# Patient Record
Sex: Female | Born: 1980 | Race: Black or African American | Hispanic: No | Marital: Single | Smoking: Never smoker
Health system: Southern US, Community
[De-identification: ages and names within clinical notes are randomized; demographics above are authoritative.]

## PROBLEM LIST (undated history)

## (undated) DIAGNOSIS — G43909 Migraine, unspecified, not intractable, without status migrainosus: Secondary | ICD-10-CM

## (undated) DIAGNOSIS — F419 Anxiety disorder, unspecified: Secondary | ICD-10-CM

---
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
## 2004-11-26 ENCOUNTER — Emergency Department (HOSPITAL_COMMUNITY): Admission: EM | Admit: 2004-11-26 | Discharge: 2004-11-26 | Payer: Self-pay | Admitting: Emergency Medicine

## 2004-11-27 ENCOUNTER — Emergency Department (HOSPITAL_COMMUNITY): Admission: EM | Admit: 2004-11-27 | Discharge: 2004-11-27 | Payer: Self-pay | Admitting: Emergency Medicine

## 2005-08-15 ENCOUNTER — Inpatient Hospital Stay (HOSPITAL_COMMUNITY): Admission: AD | Admit: 2005-08-15 | Discharge: 2005-08-20 | Payer: Self-pay | Admitting: Obstetrics and Gynecology

## 2006-10-11 ENCOUNTER — Emergency Department (HOSPITAL_COMMUNITY): Admission: EM | Admit: 2006-10-11 | Discharge: 2006-10-11 | Payer: Self-pay | Admitting: Emergency Medicine

## 2006-11-28 ENCOUNTER — Encounter: Payer: Self-pay | Admitting: Cardiology

## 2006-11-28 ENCOUNTER — Ambulatory Visit (HOSPITAL_COMMUNITY): Admission: RE | Admit: 2006-11-28 | Discharge: 2006-11-28 | Payer: Self-pay | Admitting: Obstetrics and Gynecology

## 2006-11-28 ENCOUNTER — Ambulatory Visit: Payer: Self-pay | Admitting: Cardiology

## 2007-01-24 ENCOUNTER — Inpatient Hospital Stay (HOSPITAL_COMMUNITY): Admission: AD | Admit: 2007-01-24 | Discharge: 2007-01-27 | Payer: Self-pay | Admitting: Obstetrics and Gynecology

## 2007-07-01 ENCOUNTER — Ambulatory Visit (HOSPITAL_COMMUNITY): Admission: RE | Admit: 2007-07-01 | Discharge: 2007-07-01 | Payer: Self-pay | Admitting: Obstetrics and Gynecology

## 2008-03-31 ENCOUNTER — Emergency Department (HOSPITAL_COMMUNITY): Admission: EM | Admit: 2008-03-31 | Discharge: 2008-03-31 | Payer: Self-pay | Admitting: Emergency Medicine

## 2008-12-04 ENCOUNTER — Emergency Department (HOSPITAL_COMMUNITY): Admission: EM | Admit: 2008-12-04 | Discharge: 2008-12-04 | Payer: Self-pay | Admitting: Emergency Medicine

## 2009-07-28 ENCOUNTER — Emergency Department (HOSPITAL_COMMUNITY): Admission: EM | Admit: 2009-07-28 | Discharge: 2009-07-28 | Payer: Self-pay | Admitting: Emergency Medicine

## 2010-03-20 ENCOUNTER — Emergency Department (HOSPITAL_COMMUNITY): Admission: EM | Admit: 2010-03-20 | Discharge: 2010-03-20 | Payer: Self-pay | Admitting: Family Medicine

## 2010-11-01 ENCOUNTER — Emergency Department (HOSPITAL_COMMUNITY)
Admission: EM | Admit: 2010-11-01 | Discharge: 2010-11-01 | Payer: Self-pay | Source: Home / Self Care | Admitting: Emergency Medicine

## 2010-12-10 ENCOUNTER — Encounter: Payer: Self-pay | Admitting: Obstetrics and Gynecology

## 2011-02-23 LAB — URINE CULTURE: Colony Count: >=100000

## 2011-02-23 LAB — URINALYSIS, ROUTINE W REFLEX MICROSCOPIC
Ketones, ur: NEGATIVE mg/dL
Nitrite: POSITIVE — AB
Protein, ur: 100 mg/dL — AB
Urobilinogen, UA: 1 mg/dL (ref 0.0–1.0)

## 2011-02-23 LAB — PREGNANCY, URINE: Preg Test, Ur: NEGATIVE

## 2011-03-05 LAB — POCT PREGNANCY, URINE: Preg Test, Ur: NEGATIVE

## 2011-04-06 NOTE — Op Note (Signed)
NAMEKYLII, ENNIS NO.:  [id number]   MEDICAL RECORD NO.:  [id number]          PATIENT TYPE:  INP   LOCATION:  9304                          FACILITY:  [facility]   PHYSICIAN:  Malva Limes, M.D.    DATE OF BIRTH:  07-Sep-1981   DATE OF PROCEDURE:  08/16/2005  DATE OF DISCHARGE:                                 OPERATIVE REPORT   PREOPERATIVE DIAGNOSES:  1.  Intrauterine pregnancy at term.  2.  Failure to progress.  3.  Preeclampsia.   POSTOPERATIVE DIAGNOSES:  1.  Intrauterine pregnancy at term.  2.  Failure to progress.  3.  Preeclampsia.   OPERATION/PROCEDURE:  Primary low transverse cesarean section.   SURGEON:  Malva Limes, M.D.   ANESTHESIA:  Epidural.   ANTIBIOTICS:  Ancef 1 g.   ESTIMATED BLOOD LOSS:  900 mL.   COMPLICATIONS:  None.   SPECIMENS:  None.   DRAINS:  Foley to bedside drainage.   FINDINGS:  The patient delivered one live viable black female infant in the  OP position.  Placenta appeared to be normal. Fallopian tubes, ovaries and  uterus were all normal.   DESCRIPTION OF PROCEDURE:  The patient was taken to the operating room where  her epidural anesthetic was reinjected.  Once an adequate level was reached,  the patient was prepped with Betadine and draped in the usual fashion for  this procedure.   A Pfannenstiel incision was made.  This was carried down to the fascia.  The  fascia was entered in the midline and extended laterally with Mayo scissors.  Rectus muscles were dissected from the fascia with the Bovie.  Rectus  muscles were divided in the midline and taken superiorly and inferiorly.  Parietal peritoneum was entered bluntly and bladder flap was taken down  sharply.  A low transverse uterine incision was made in the midline and  extended laterally with blunt dissection.  Amniotic fluid was noted to be  clear.  The infant was delivered in the vertex presentation.  The oropharynx  and nostrils were bulb suctioned.  The  remaining infant was then delivered.  Cord blood was then obtained.  The cord was doubly clamped, cut and the  infant handed to the waiting NICU team.  The placenta was mainly removed and  handed off for cord blood banking.  The uterus was exteriorized.  The  uterine cavity was cleaned with a wet lap.  The uterine incision was closed  in a single layer of 0 Monocryl in a running locking fashion.  There was an  area of bleeding on the left margin which was made hemostatic with  interrupted 0 Monocryl suture in a figure-of-eight fashion.  Bladder flap  was closed using 2-0 Monocryl in a running fashion.   The uterus was placed back in the abdominal cavity.  Hemostasis was checked  and found to be adequate.  Parietal peritoneum and rectus muscle were  reapproximated in the midline using 2 Monocryl in a running fashion. The  fascia was closed using 0 Monocryl suture in a running fashion.  Subcuticular tissue was made hemostatic with  the Bovie.  Stainless steel  clips were to close the skin. The patient tolerated the procedure well.  She  was taken to the recovery room in stable condition.  Instrument and laps  counts were correct x2.           ______________________________  Malva Limes, M.D.     MA/MEDQ  D:  08/16/2005  T:  08/17/2005  Job:  [id number]

## 2011-04-06 NOTE — Discharge Summary (Signed)
Michele Harrison, ERBY NO.:  [id number]   MEDICAL RECORD NO.:  [id number]          PATIENT TYPE:  INP   LOCATION:  9111                          FACILITY:  [facility]   PHYSICIAN:  Gerrit Friends. Aldona Bar, M.D.   DATE OF BIRTH:  1981/06/26   DATE OF ADMISSION:  01/24/2007  DATE OF DISCHARGE:                               DISCHARGE SUMMARY   DISCHARGE DIAGNOSES:  1. Term pregnancy, delivered; viable female infant, 8 pounds 6 ounces,      Apgars 8 and 9.  2. Blood type O positive.  3. Breech presentation.  4. Previous cesarean section.   PROCEDURES:  Repeat low-transverse cesarean section.   SUMMARY:  This 30 year old, a gravida 3, para 2, abortus 1 was admitted  to the hospital on January 24, 2007, after a benign pregnancy for delivery  by repeat cesarean section with a known breech presentation.  She was  taken to the operating room and delivered without incident and her  postpartum course was totally benign.  She remained afebrile during her  postoperative course.  Her discharge hemoglobin was 10.1 with a white  count of 14,900 and a platelet count of 170,000.  On the morning of  March 10 she was ambulating well, tolerating a regular diet well, having  normal bowel and bladder function, was afebrile.  Her wound was clean  and dry and she was breast-feeding without difficulty.  Her staples were  removed and her wound was steri-stripped at the time of discharge, and  all appropriate instructions were given to the patient per discharge  brochure.  She will return the office for followup in approximately four  weeks' time or as needed.   CONDITION ON DISCHARGE:  Improved.   MEDICATIONS ON DISCHARGE:  1. Prenatal vitamins - one a day.  2. Ferrous sulfate 300 mg daily.  3. Motrin 600 mg every 6 hours as needed for cramping or pain.  4. Tylox one to two every 4-6 hours as needed for more severe pain.      Gerrit Friends. Aldona Bar, M.D.  Electronically Signed     RMW/MEDQ  D:   01/27/2007  T:  01/27/2007  Job:  [id number]

## 2011-04-06 NOTE — Op Note (Signed)
NAME:  Michele Harrison, NICODEMUS NO.:  [id number]   MEDICAL RECORD NO.:  [id number]          PATIENT TYPE:  INP   LOCATION:  9199                          FACILITY:  [facility]   PHYSICIAN:  Malva Limes, M.D.    DATE OF BIRTH:  [birth year]   DATE OF PROCEDURE:  01/24/2007  DATE OF DISCHARGE:                               OPERATIVE REPORT   PREOPERATIVE DIAGNOSES:  1. Intrauterine pregnancy at term.  2. History of prior cesarean section.  3. Patient desires repeat cesarean section.  4. Breech presentation.   POSTOPERATIVE DIAGNOSES:  1. Intrauterine pregnancy at term.  2. History of prior cesarean section.  3. Patient desires repeat cesarean section.  4. Breech presentation.   PROCEDURE:  Repeat low transverse cesarean section.   SURGEON:  Malva Limes, M.D.   ANESTHESIA:  Spinal anesthesia.   ASSISTANT:  Luvenia Redden, M.D.   ANTIBIOTICS:  Ancef 1 gram.   DRAINS:  Foley to bedside drainage.   ESTIMATED BLOOD LOSS:  900 mL.   COMPLICATIONS:  None.   SPECIMENS:  None.   FINDINGS:  The patient delivered one live viable black female infant,  weighing 8 pounds 6 ounces, Apgars 8 at one minute, 9 at five minutes.   DESCRIPTION OF PROCEDURE:  The patient was taken to the operating room  where spinal anesthesia was administered without complications.  She was  then placed in the dorsal supine position with a left lateral tilt.  The  patient was then prepped with Betadine and a Foley catheter was placed.  She was then draped in the usual fashion for this procedure.  An  incision was made through the previous scar.  This was carried down to  the fascia.  The fascia was entered in the midline and extended  laterally with Mayo scissors.  The rectus muscles were then dissected  from the fascia with the Bovie.  The rectus muscles were divided in the  midline and taken superiorly to inferiorly.  The parietal peritoneum was  entered sharply and taken superiorly and  inferiorly.  The bladder flap  was not taken down.  A low transverse uterine incision was made in the  midline and extended laterally with blunt dissection.  Amniotic fluid  was noted to be clear.  The infant was delivered in the breech  presentation.  On delivery of the head, the oropharynx and nostrils were  bulb suctioned, the cord doubly clamped and cut and the infant was  handed to the waiting NICU team.  Cord blood was then obtained, the  placenta was manually removed.  The uterus was exteriorized and the  uterine cavity was cleaned with wet laps.  The uterine incision was  closed in a single layer of 0 Monocryl suture in a running locked  fashion.  The uterus was placed back in the abdominal cavity and checked  for hemostasis, it appeared to be adequate.  The parietal peritoneum and  rectus muscles were reapproximated in the midline using 2-0 Monocryl in  a running fashion.  The fascia was closed using 0 Monocryl suture in a  running fashion.  The subcuticular tissue was made hemostatic with  Bovie.  Stainless steel clips were used to close the skin.  The patient  tolerated the procedure well,  she was taken to the recovery room in  stable condition.  Instrument and lap counts were correct x2.           ______________________________  Malva Limes, M.D.     MA/MEDQ  D:  01/24/2007  T:  01/24/2007  Job:  [id number]

## 2011-04-06 NOTE — Discharge Summary (Signed)
Michele Harrison, Michele Harrison NO.:  [id number]   MEDICAL RECORD NO.:  [id number]          PATIENT TYPE:  INP   LOCATION:  9110                          FACILITY:  [facility]   PHYSICIAN:  Randye Lobo, M.D.   DATE OF BIRTH:  [birth year]   DATE OF ADMISSION:  08/15/2005  DATE OF DISCHARGE:  08/20/2005                                 DISCHARGE SUMMARY   FINAL DIAGNOSES:  1.  Intrauterine pregnancy at term.  2.  Failure to progress.  3.  Preeclampsia.  4.  Postoperative fever.   PROCEDURE:  Primary low transverse cesarean section. Surgeon:  Dr. Malva Limes. Complications:  None.   This 30 year old G2 P0-0-1-0 presents to [facility] for an induction  secondary to postdates status. The patient's antepartum course up to this  point had been uncomplicated. Cytotec was begun and amniotomy was carried  out with clear fluids, and augmentation was started with Pitocin. The  patient had a protracted labor curve and had no change in her cervix, and at  this point a decision was made to proceed with a cesarean section. The  patient was taken to the operating room on August 16, 2005, by Dr. Malva Limes where a primary low transverse cesarean section was performed with  delivery of an 8-pound 2-ounce female infant with Apgars of 9 and 9.  Postoperatively, the patient's blood pressure started to increase and she  was showing signs of preeclampsia. She was started on magnesium sulfate,  labs were obtained, and urinary output was watched carefully. By  postoperative day #2 the patient had some postoperative anemia as well and  was started on some iron. Her blood pressures were returning to more normal  levels, as well as her labs, and magnesium sulfate was able to be stopped.  Later on that day, the patient started to have low-grade fevers, was started  on Unasyn 1.5 g every 6 hours and was watched carefully. By postoperative  day #4 the patient was still having some low-grade  temperatures but no signs  of any endometritis. She had been on Unasyn already for 24 hours and she was  felt ready for discharge. She was sent home on a regular diet, told to  decrease her activities, told to continue her iron supplement three times a  day, was given Percocet one to two every 4 hours as needed for pain, told  she could use over-the-counter ibuprofen up to 600 mg every 6 hours as  needed for pain, was to follow up in our office in 1 week for blood pressure  check, was also to call with any temperature above 100 degrees and/or  increased pain.   LABORATORY ON DISCHARGE:  The patient had a hemoglobin of 7.6; white blood  cell count of 14.6; platelets of 146,000; and normal LDH, uric acid, and  liver function tests.     Leilani Able, P.A.-C.      Randye Lobo, M.D.  Electronically Signed   MB/MEDQ  D:  09/20/2005  T:  09/20/2005  Job:  [id number]

## 2011-11-28 ENCOUNTER — Encounter (HOSPITAL_COMMUNITY): Payer: Self-pay | Admitting: *Deleted

## 2011-11-28 ENCOUNTER — Emergency Department (HOSPITAL_COMMUNITY)
Admission: EM | Admit: 2011-11-28 | Discharge: 2011-11-28 | Disposition: A | Discharge status: Home / Self Care | Payer: Medicaid Other | Attending: Emergency Medicine | Admitting: Emergency Medicine

## 2011-11-28 DIAGNOSIS — R1310 Dysphagia, unspecified: Secondary | ICD-10-CM | POA: Insufficient documentation

## 2011-11-28 DIAGNOSIS — H9209 Otalgia, unspecified ear: Secondary | ICD-10-CM | POA: Insufficient documentation

## 2011-11-28 DIAGNOSIS — R070 Pain in throat: Secondary | ICD-10-CM | POA: Insufficient documentation

## 2011-11-28 DIAGNOSIS — J020 Streptococcal pharyngitis: Secondary | ICD-10-CM

## 2011-11-28 MED ORDER — PENICILLIN V POTASSIUM 500 MG PO TABS: 500.0000 mg | ORAL_TABLET | Freq: Three times a day (TID) | ORAL | Status: AC

## 2011-11-28 NOTE — ED Notes (Signed)
To ed for eval of sore throat, states she was seen by pmd a week ago and told she had throat abscess. States she was given a shot of abx as well as oral abx. Now entire throat sore and both ear with pain. Airway patent. Appears in nad.

## 2011-11-28 NOTE — ED Provider Notes (Signed)
History     CSN: [id number]  Arrival date & time 11/28/11  1030   First MD Initiated Contact with Patient 11/28/11 1145      Chief Complaint  Patient presents with  . Sore Throat    (Consider location/radiation/quality/duration/timing/severity/associated sxs/prior treatment) HPI Patient states she's been having trouble with a sore throat since about one week ago. Patient states she saw her primary Dr. and was told she had a possible abscess. She was given a shot of antibiotics and prescribed oral antibiotics. Patient states she started to feel a little bit better but then once she finished the medications it returned. Patient says she has some pain in both of her ears as well she's not having trouble breathing. It does hurt when she swallows. Patient denies any cough fevers or abdominal pain. History reviewed. No pertinent past medical history.  History reviewed. No pertinent past surgical history.  No family history on file.  History  Substance Use Topics  . Smoking status: Never Smoker   . Smokeless tobacco: Not on file  . Alcohol Use:     OB History    Grav Para Term Preterm Abortions TAB SAB Ect Mult Living                  Review of Systems  All other systems reviewed and are negative.    Allergies  Review of patient's allergies indicates no known allergies.  Home Medications   Current Outpatient Rx  Name Route Sig Dispense Refill  . AZITHROMYCIN 250 MG PO TABS Oral Take 250 mg by mouth daily. Finished 11/27/11    . IBUPROFEN 200 MG PO TABS Oral Take 200 mg by mouth every 6 (six) hours as needed. For pain    . PRESCRIPTION MEDICATION Oral Take 1 tablet by mouth daily. For congestion      BP 116/71  Pulse 94  Temp(Src) 97.9 F (36.6 C) (Oral)  Resp 18  SpO2 100%  Physical Exam  Nursing note and vitals reviewed. Constitutional: She appears well-developed and well-nourished. No distress.  HENT:  Head: Normocephalic and atraumatic.  Right Ear: Tympanic  membrane and external ear normal.  Left Ear: Tympanic membrane and external ear normal.  Mouth/Throat: Posterior oropharyngeal erythema present. No oropharyngeal exudate, posterior oropharyngeal edema or tonsillar abscesses.  Eyes: Conjunctivae are normal. Right eye exhibits no discharge. Left eye exhibits no discharge. No scleral icterus.  Neck: Neck supple. No tracheal deviation present.  Cardiovascular: Normal rate.   Pulmonary/Chest: Effort normal. No stridor. No respiratory distress.  Abdominal: She exhibits no distension and no mass. There is no tenderness. There is no rebound.  Musculoskeletal: She exhibits no edema.  Neurological: She is alert. Cranial nerve deficit: no gross deficits.  Skin: Skin is warm and dry. No rash noted.  Psychiatric: She has a normal mood and affect.    ED Course  Procedures (including critical care time)  Labs Reviewed  RAPID STREP SCREEN - Abnormal; Notable for the following:    Streptococcus, Group A Screen (Direct) POSITIVE (*)    All other components within normal limits  MONONUCLEOSIS SCREEN   No results found.   1. Strep throat       MDM  Patient has strep throat. We'll give her prescription for penicillin. Patient encouraged to follow up with her doctor if not improving. No evidence of abscess on my exam the        Celene Kras, MD 11/28/11 [phone]

## 2011-11-28 NOTE — ED Notes (Signed)
States was tx 5 days ago [place] was given shot and other rx for congestion but now still having throat pain and it is affecting both ears

## 2013-02-07 ENCOUNTER — Emergency Department (HOSPITAL_COMMUNITY)
Admission: EM | Admit: 2013-02-07 | Discharge: 2013-02-07 | Disposition: A | Discharge status: Home / Self Care | Payer: PRIVATE HEALTH INSURANCE | Attending: Emergency Medicine | Admitting: Emergency Medicine

## 2013-02-07 ENCOUNTER — Encounter (HOSPITAL_COMMUNITY): Payer: Self-pay | Admitting: *Deleted

## 2013-02-07 ENCOUNTER — Emergency Department (HOSPITAL_COMMUNITY): Payer: PRIVATE HEALTH INSURANCE

## 2013-02-07 DIAGNOSIS — J029 Acute pharyngitis, unspecified: Secondary | ICD-10-CM | POA: Insufficient documentation

## 2013-02-07 DIAGNOSIS — J069 Acute upper respiratory infection, unspecified: Secondary | ICD-10-CM | POA: Insufficient documentation

## 2013-02-07 DIAGNOSIS — R599 Enlarged lymph nodes, unspecified: Secondary | ICD-10-CM | POA: Insufficient documentation

## 2013-02-07 DIAGNOSIS — G479 Sleep disorder, unspecified: Secondary | ICD-10-CM | POA: Insufficient documentation

## 2013-02-07 DIAGNOSIS — R1110 Vomiting, unspecified: Secondary | ICD-10-CM | POA: Insufficient documentation

## 2013-02-07 MED ORDER — IBUPROFEN 800 MG PO TABS: 800.0000 mg | ORAL_TABLET | Freq: Three times a day (TID) | ORAL | Status: DC

## 2013-02-07 MED ORDER — IBUPROFEN 800 MG PO TABS
800.0000 mg | ORAL_TABLET | Freq: Once | ORAL | Status: AC
  Administered 2013-02-07: 800 mg via ORAL
  Filled 2013-02-07: qty 1

## 2013-02-07 MED ORDER — ALBUTEROL SULFATE HFA 108 (90 BASE) MCG/ACT IN AERS: 2.0000 | INHALATION_SPRAY | RESPIRATORY_TRACT | Status: DC | PRN

## 2013-02-07 MED ORDER — PROMETHAZINE HCL 25 MG PO TABS: 25.0000 mg | ORAL_TABLET | Freq: Four times a day (QID) | ORAL | Status: DC | PRN

## 2013-02-07 MED ORDER — ACETAMINOPHEN-CODEINE 120-12 MG/5ML PO SOLN
5.0000 mL | Freq: Once | ORAL | Status: AC
  Administered 2013-02-07: 5 mL via ORAL
  Filled 2013-02-07: qty 10

## 2013-02-07 MED ORDER — ACETAMINOPHEN-CODEINE 120-12 MG/5ML PO SUSP: 5.0000 mL | Freq: Four times a day (QID) | ORAL | Status: DC | PRN

## 2013-02-07 NOTE — ED Notes (Signed)
Pt c/o productive cough x 2 weeks.  Tonight, states she coughed so much it caused her to vomit.

## 2013-02-07 NOTE — ED Provider Notes (Signed)
History     CSN: [id number]  Arrival date & time 02/07/13  4098   First MD Initiated Contact with Patient 02/07/13 0411      Chief Complaint  Patient presents with  . Cough    (Consider location/radiation/quality/duration/timing/severity/associated sxs/prior treatment) HPI Hx per PT. Sick x 2 weeks with cough that started as a "head cold" and now she has soret throat, dry cough, and with coughing spells some post tussive emesis.  Taking OTC medications with minal relief, no known sick contacts, moderate in severity, trouble sleeping tonight with these symptoms. No rash. No ABD pain,  No fevers/ chills.  History reviewed. No pertinent past medical history.  Past Surgical History  Procedure Laterality Date  . Cesarean section      History reviewed. No pertinent family history.  History  Substance Use Topics  . Smoking status: Never Smoker   . Smokeless tobacco: Not on file  . Alcohol Use: Yes    OB History   Grav Para Term Preterm Abortions TAB SAB Ect Mult Living                  Review of Systems  Constitutional: Negative for fever and chills.  HENT: Negative for mouth sores, trouble swallowing, neck pain, neck stiffness and voice change.   Eyes: Negative for pain and visual disturbance.  Respiratory: Positive for cough. Negative for chest tightness and shortness of breath.   Cardiovascular: Negative for chest pain.  Gastrointestinal: Negative for abdominal pain and blood in stool.  Genitourinary: Negative for dysuria.  Musculoskeletal: Negative for back pain.  Skin: Negative for rash.  Neurological: Negative for headaches.  All other systems reviewed and are negative.    Allergies  Review of patient's allergies indicates no known allergies.  Home Medications   Current Outpatient Rx  Name  Route  Sig  Dispense  Refill  . azithromycin (ZITHROMAX) 250 MG tablet   Oral   Take 250 mg by mouth daily. Finished 11/27/11         . ibuprofen (ADVIL,MOTRIN) 200  MG tablet   Oral   Take 200 mg by mouth every 6 (six) hours as needed. For pain         . PRESCRIPTION MEDICATION   Oral   Take 1 tablet by mouth daily. For congestion           BP 120/85  Pulse 78  Temp(Src) 98.8 F (37.1 C) (Oral)  Resp 16  SpO2 98%  LMP 12/26/2012  Physical Exam  Constitutional: She is oriented to person, place, and time. She appears well-developed and well-nourished.  HENT:  Head: Normocephalic and atraumatic.  Mouth/Throat: Oropharynx is clear and moist. No oropharyngeal exudate.  Eyes: EOM are normal. Pupils are equal, round, and reactive to light.  Neck: Normal range of motion. Neck supple. No tracheal deviation present.  Cardiovascular: Normal rate, regular rhythm and intact distal pulses.   Pulmonary/Chest: Effort normal. No stridor. No respiratory distress. She has no wheezes. She has no rales. She exhibits no tenderness.  Mildly dec bilateral breath sounds  Abdominal: Soft. Bowel sounds are normal. She exhibits no distension. There is no tenderness.  Musculoskeletal: Normal range of motion. She exhibits no edema.  Lymphadenopathy:    She has cervical adenopathy.  Neurological: She is alert and oriented to person, place, and time.  Skin: Skin is warm and dry.    ED Course  Procedures (including critical care time)  Dg Chest 2 View  02/07/2013  *RADIOLOGY  REPORT*  Clinical Data: Productive cough  CHEST - 2 VIEW  Comparison: None.  Findings: Lungs are clear. No pleural effusion or pneumothorax. The cardiomediastinal contours are within normal limits. The visualized bones and soft tissues are without significant appreciable abnormality.  IMPRESSION: No radiographic evidence of acute cardiopulmonary process.   Original Report Authenticated By: Jearld Lesch, M.D.    Albuterol and codeine provided. CXR reviewed - plan outpatient treatment for URI with precautions verbalized as understood.    MDM  URI symptoms and post tussive emesis.   CXR  obtained. Medications provided  VS and nursing notes reviewed and considered        [name], MD 02/07/13 [phone]

## 2014-11-02 ENCOUNTER — Encounter (HOSPITAL_COMMUNITY): Payer: Self-pay

## 2014-11-02 ENCOUNTER — Emergency Department (HOSPITAL_COMMUNITY)
Admission: EM | Admit: 2014-11-02 | Discharge: 2014-11-02 | Disposition: A | Discharge status: Home / Self Care | Payer: No Typology Code available for payment source | Attending: Emergency Medicine | Admitting: Emergency Medicine

## 2014-11-02 DIAGNOSIS — F419 Anxiety disorder, unspecified: Secondary | ICD-10-CM | POA: Diagnosis not present

## 2014-11-02 DIAGNOSIS — R112 Nausea with vomiting, unspecified: Secondary | ICD-10-CM | POA: Insufficient documentation

## 2014-11-02 DIAGNOSIS — Z79899 Other long term (current) drug therapy: Secondary | ICD-10-CM | POA: Diagnosis not present

## 2014-11-02 DIAGNOSIS — N390 Urinary tract infection, site not specified: Secondary | ICD-10-CM | POA: Diagnosis not present

## 2014-11-02 DIAGNOSIS — R51 Headache: Secondary | ICD-10-CM | POA: Diagnosis present

## 2014-11-02 DIAGNOSIS — Z791 Long term (current) use of non-steroidal anti-inflammatories (NSAID): Secondary | ICD-10-CM | POA: Insufficient documentation

## 2014-11-02 DIAGNOSIS — Z3202 Encounter for pregnancy test, result negative: Secondary | ICD-10-CM | POA: Diagnosis not present

## 2014-11-02 HISTORY — DX: Anxiety disorder, unspecified: F41.9

## 2014-11-02 LAB — CBC WITH DIFFERENTIAL/PLATELET
BASOS ABS: 0 10*3/uL (ref 0.0–0.1)
BASOS PCT: 0 % (ref 0–1)
EOS PCT: 1 % (ref 0–5)
Eosinophils Absolute: 0.1 10*3/uL (ref 0.0–0.7)
HEMATOCRIT: 38.3 % (ref 36.0–46.0)
Hemoglobin: 12.3 g/dL (ref 12.0–15.0)
Lymphocytes Relative: 25 % (ref 12–46)
Lymphs Abs: 2 10*3/uL (ref 0.7–4.0)
MCH: 28.1 pg (ref 26.0–34.0)
MCHC: 32.1 g/dL (ref 30.0–36.0)
MCV: 87.6 fL (ref 78.0–100.0)
MONO ABS: 0.5 10*3/uL (ref 0.1–1.0)
Monocytes Relative: 6 % (ref 3–12)
Neutro Abs: 5.5 10*3/uL (ref 1.7–7.7)
Neutrophils Relative %: 68 % (ref 43–77)
Platelets: 194 10*3/uL (ref 150–400)
RBC: 4.37 MIL/uL (ref 3.87–5.11)
RDW: 12.7 % (ref 11.5–15.5)
WBC: 8.1 10*3/uL (ref 4.0–10.5)

## 2014-11-02 LAB — BASIC METABOLIC PANEL
Anion gap: 9 (ref 5–15)
BUN: 11 mg/dL (ref 6–23)
CALCIUM: 9.4 mg/dL (ref 8.4–10.5)
CO2: 24 mEq/L (ref 19–32)
Chloride: 106 mEq/L (ref 96–112)
Creatinine, Ser: 0.72 mg/dL (ref 0.50–1.10)
GFR calc Af Amer: >90 mL/min (ref >90)
GLUCOSE: 91 mg/dL (ref 70–99)
Potassium: 4.7 mEq/L (ref 3.7–5.3)
SODIUM: 139 meq/L (ref 137–147)

## 2014-11-02 LAB — URINALYSIS, ROUTINE W REFLEX MICROSCOPIC
Bilirubin Urine: NEGATIVE
Glucose, UA: NEGATIVE mg/dL
HGB URINE DIPSTICK: NEGATIVE
Ketones, ur: NEGATIVE mg/dL
Leukocytes, UA: NEGATIVE
NITRITE: POSITIVE — AB
PROTEIN: NEGATIVE mg/dL
Specific Gravity, Urine: 1.018 (ref 1.005–1.030)
UROBILINOGEN UA: 1 mg/dL (ref 0.0–1.0)
pH: 7.5 (ref 5.0–8.0)

## 2014-11-02 LAB — URINE MICROSCOPIC-ADD ON

## 2014-11-02 LAB — PREGNANCY, URINE: PREG TEST UR: NEGATIVE

## 2014-11-02 LAB — LIPASE, BLOOD: LIPASE: 18 U/L (ref 11–59)

## 2014-11-02 MED ORDER — CEPHALEXIN 500 MG PO CAPS: 500.0000 mg | ORAL_CAPSULE | Freq: Four times a day (QID) | ORAL | Status: DC

## 2014-11-02 MED ORDER — ACETAMINOPHEN 500 MG PO TABS
1000.0000 mg | ORAL_TABLET | Freq: Once | ORAL | Status: AC
  Administered 2014-11-02: 1000 mg via ORAL
  Filled 2014-11-02: qty 2

## 2014-11-02 MED ORDER — SODIUM CHLORIDE 0.9 % IV BOLUS (SEPSIS)
1000.0000 mL | Freq: Once | INTRAVENOUS | Status: AC
  Administered 2014-11-02: 1000 mL via INTRAVENOUS

## 2014-11-02 NOTE — ED Notes (Signed)
Pt ambulated to bathroom to give urine specimen 

## 2014-11-02 NOTE — ED Notes (Signed)
Pt here for headaches, anxiety attacks for about a month now. Thinks this may be from stress but not sure. States she has been running fevers off and on also

## 2014-11-02 NOTE — ED Notes (Signed)
Pt unable to urinate at this time.  

## 2014-11-02 NOTE — ED Provider Notes (Signed)
CSN: [id number]     Arrival date & time 11/02/14  0910 History   First MD Initiated Contact with Patient 11/02/14 [phone]     Chief Complaint  Patient presents with  . Headache   Michele Harrison is a 33 y.o. female with a history of anxiety who presents to the emergency department complaining of feeling very anxious for the past 5 months intermittently, as well as a headache and intermittent abdominal cramping. Patient presents with feeling very anxious intermittently for the past month and she feels very anxious and nervous right now since 5 AM this morning. The patient also reports a headache since yesterday at 3 PM there is generalized to her head. Patient reports blurry vision with these headaches. Patient reports her headache is 8 out of 10 and sharp. The patient also reports associated nausea. The patient reports she had intermittent abdominal cramping but none currently. Patient reports she vomited up her breakfast this morning. She reports intermittent subjective fevers. Patient also reports she is feeling palpitations in her chest with anxiety. Patient does not take any medicines for anxiety. The patient denies suicidal or homicidal ideations. The patient denies dysuria, hematuria, lightheadedness, weakness, numbness, tingling, vaginal bleeding, vaginal discharge, diarrhea, ear pain, nasal congestion, chest pain, shortness of breath or wheezing.  (Consider location/radiation/quality/duration/timing/severity/associated sxs/prior Treatment) HPI  Past Medical History  Diagnosis Date  . Anxiety    Past Surgical History  Procedure Laterality Date  . Cesarean section     History reviewed. No pertinent family history. History  Substance Use Topics  . Smoking status: Never Smoker   . Smokeless tobacco: Not on file  . Alcohol Use: Yes   OB History    No data available     Review of Systems  Constitutional: Positive for fever. Negative for chills.  HENT: Negative for congestion, ear  pain, sinus pressure, sneezing and trouble swallowing.   Eyes: Negative for pain and visual disturbance.  Respiratory: Negative for cough, shortness of breath and wheezing.   Cardiovascular: Positive for palpitations. Negative for chest pain and leg swelling.  Gastrointestinal: Positive for nausea, vomiting and abdominal pain. Negative for diarrhea and blood in stool.  Genitourinary: Negative for dysuria, urgency, hematuria, vaginal bleeding, vaginal discharge and difficulty urinating.  Musculoskeletal: Negative for myalgias, back pain, neck pain and neck stiffness.  Skin: Negative for rash and wound.  Neurological: Positive for headaches. Negative for weakness, light-headedness and numbness.  All other systems reviewed and are negative.     Allergies  Other  Home Medications   Prior to Admission medications   Medication Sig Start Date End Date Taking? Authorizing Provider  ibuprofen (ADVIL,MOTRIN) 200 MG tablet Take 400 mg by mouth every 6 (six) hours as needed for pain. For pain   Yes Historical Provider, MD  levonorgestrel (MIRENA) 20 MCG/24HR IUD 1 each by Intrauterine route continuous.   Yes Historical Provider, MD  acetaminophen-codeine 120-12 MG/5ML suspension Take 5 mLs by mouth every 6 (six) hours as needed for pain. Patient not taking: Reported on 11/02/2014 02/07/13   Sunnie NielsenBrian Opitz, MD  cephALEXin (KEFLEX) 500 MG capsule Take 1 capsule (500 mg total) by mouth 4 (four) times daily. 11/02/14   Einar GipWilliam Duncan Wilhemenia Camba, PA-C  ibuprofen (ADVIL,MOTRIN) 800 MG tablet Take 1 tablet (800 mg total) by mouth 3 (three) times daily. Patient not taking: Reported on 11/02/2014 02/07/13   Sunnie NielsenBrian Opitz, MD  promethazine (PHENERGAN) 25 MG tablet Take 1 tablet (25 mg total) by mouth every 6 (six) hours as  needed for nausea. Patient not taking: Reported on 11/02/2014 02/07/13   Sunnie NielsenBrian Opitz, MD   BP 110/91 mmHg  Pulse 65  Temp(Src) 98.1 F (36.7 C) (Oral)  Resp 18  SpO2 100%  LMP  10/25/2014 Physical Exam  Constitutional: She is oriented to person, place, and time. She appears well-developed and well-nourished. No distress.  Patient appears anxious   HENT:  Head: Normocephalic and atraumatic.  Right Ear: External ear normal.  Left Ear: External ear normal.  Nose: Nose normal.  Mouth/Throat: Oropharynx is clear and moist. No oropharyngeal exudate.  Bilateral tympanic membranes are pearly gray without erythema or loss of landmarks. No temporal tenderness.  Eyes: Conjunctivae and EOM are normal. Pupils are equal, round, and reactive to light. Right eye exhibits no discharge. Left eye exhibits no discharge.  Neck: Normal range of motion. Neck supple.  Cardiovascular: Normal rate, regular rhythm, normal heart sounds and intact distal pulses.  Exam reveals no gallop and no friction rub.   No murmur heard. Pulmonary/Chest: Effort normal and breath sounds normal. No respiratory distress. She has no wheezes. She has no rales. She exhibits no tenderness.  Abdominal: Soft. Bowel sounds are normal. She exhibits no distension and no mass. There is no tenderness. There is no rebound and no guarding.  Abdomen is soft and nontender to palpation. No rebound tenderness. No right lower quadrant tenderness. Negative Rovsing sign. Negative psoas and obturator sign.  Musculoskeletal: She exhibits no edema.  Patient is able to angulate without difficulty or assistance.  Lymphadenopathy:    She has no cervical adenopathy.  Neurological: She is alert and oriented to person, place, and time. No cranial nerve deficit. Coordination normal.  Patient's strength is 5 out of 5 in her bilateral upper and lower extremities. Cranial nerves II through XII are intact bilaterally.  Skin: Skin is warm and dry. No rash noted. She is not diaphoretic. No erythema. No pallor.  Psychiatric: Her behavior is normal. Her mood appears anxious.  Patient appears slightly anxious.   Nursing note and vitals  reviewed.   ED Course  Procedures (including critical care time) Labs Review Labs Reviewed  URINALYSIS, ROUTINE W REFLEX MICROSCOPIC - Abnormal; Notable for the following:    APPearance TURBID (*)    Nitrite POSITIVE (*)    All other components within normal limits  URINE MICROSCOPIC-ADD ON - Abnormal; Notable for the following:    Squamous Epithelial / LPF FEW (*)    Bacteria, UA MANY (*)    All other components within normal limits  PREGNANCY, URINE  BASIC METABOLIC PANEL  LIPASE, BLOOD  CBC WITH DIFFERENTIAL    Imaging Review No results found.   EKG Interpretation   Date/Time:  Tuesday November 02 2014 10:07:20 EST Ventricular Rate:  58 PR Interval:  163 QRS Duration: 91 QT Interval:  427 QTC Calculation: 419 R Axis:   77 Text Interpretation:  Sinus rhythm No old tracing to compare Confirmed by  [facility]  MD, ELLIOTT [phone]([id number]) on 11/02/2014 1:15:34 PM      Filed Vitals:   11/02/14 1130 11/02/14 1145 11/02/14 1200 11/02/14 1246  BP: 126/78 117/77 119/75 110/91  Pulse: 68 70 69 65  Temp:    98.1 F (36.7 C)  TempSrc:    Oral  Resp: 18 15 17 18   SpO2: 99% 100% 99% 100%     MDM   Meds given in ED:  Medications  sodium chloride 0.9 % bolus 1,000 mL (0 mLs Intravenous Stopped 11/02/14 1055)  acetaminophen (  TYLENOL) tablet 1,000 mg (1,000 mg Oral Given 11/02/14 1011)    Discharge Medication List as of 11/02/2014 12:40 PM    START taking these medications   Details  cephALEXin (KEFLEX) 500 MG capsule Take 1 capsule (500 mg total) by mouth 4 (four) times daily., Starting 11/02/2014, Until Discontinued, Print        Final diagnoses:  Anxiety  UTI (lower urinary tract infection)   Michele Harrison is a 33 y.o. female with a history of anxiety who presents to the emergency department complaining of feeling very anxious for the past 5 months intermittently, as well as a headache and intermittent abdominal cramping. The patient denies current abdominal pain.  Patient is afebrile and nontoxic-appearing. Patient does appear slightly anxious. Patient's abdomen is soft and nontender to palpation. The patient denies suicidal or homicidal ideations. Patient had a negative urine pregnancy test. The patient's urinalysis was nitrite positive with many bacteria. The patient's BMP and CBC are unremarkable. Patient's lipase is 11. After Tylenol and a fluid bolus of patient reports her headache is resolved and she's feeling much better. We'll treat this patient for urinary tract infection with Keflex. Patient is tolerating by mouth fluids and crackers. Advised patient she needs to follow-up with her primary care provider this week for her anxiety. Patient was provided resources for outpatient treatment. Advised patient return to the emergency department or worsening symptoms or new concerns. The patient verbalized understanding and agreement with plan.  This patient was discussed with Dr. Noland Fordyce who agrees with assessment and plan.     Lawana Chambers, PA-C 11/02/14 1742  Flint Melter, MD 11/02/14 2028

## 2014-11-02 NOTE — Discharge Instructions (Signed)
Generalized Anxiety Disorder Generalized anxiety disorder (GAD) is a mental disorder. It interferes with life functions, including relationships, work, and school. GAD is different from normal anxiety, which everyone experiences at some point in their lives in response to specific life events and activities. Normal anxiety actually helps us prepare for and get through these life events and activities. Normal anxiety goes away after the event or activity is over.  GAD causes anxiety that is not necessarily related to specific events or activities. It also causes excess anxiety in proportion to specific events or activities. The anxiety associated with GAD is also difficult to control. GAD can vary from mild to severe. People with severe GAD can have intense waves of anxiety with physical symptoms (panic attacks).  SYMPTOMS The anxiety and worry associated with GAD are difficult to control. This anxiety and worry are related to many life events and activities and also occur more days than not for 6 months or longer. People with GAD also have three or more of the following symptoms (one or more in children):  Restlessness.   Fatigue.  Difficulty concentrating.   Irritability.  Muscle tension.  Difficulty sleeping or unsatisfying sleep. DIAGNOSIS GAD is diagnosed through an assessment by your health care provider. Your health care provider will ask you questions aboutyour mood,physical symptoms, and events in your life. Your health care provider may ask you about your medical history and use of alcohol or drugs, including prescription medicines. Your health care provider may also do a physical exam and blood tests. Certain medical conditions and the use of certain substances can cause symptoms similar to those associated with GAD. Your health care provider may refer you to a mental health specialist for further evaluation. TREATMENT The following therapies are usually used to treat GAD:    Medication. Antidepressant medication usually is prescribed for long-term daily control. Antianxiety medicines may be added in severe cases, especially when panic attacks occur.   Talk therapy (psychotherapy). Certain types of talk therapy can be helpful in treating GAD by providing support, education, and guidance. A form of talk therapy called cognitive behavioral therapy can teach you healthy ways to think about and react to daily life events and activities.  Stress managementtechniques. These include yoga, meditation, and exercise and can be very helpful when they are practiced regularly. A mental health specialist can help determine which treatment is best for you. Some people see improvement with one therapy. However, other people require a combination of therapies. Document Released: 03/02/2013 Document Revised: 03/22/2014 Document Reviewed: 03/02/2013 [facility] Patient Information 2015 [place]coeExitCare, [place]LLC. This information is not intended to replace advice given to you by your health care provider. Make sure you discuss any questions you have with your health care provider. Urinary Tract Infection Urinary tract infections (UTIs) can develop anywhere along your urinary tract. Your urinary tract is your body's drainage system for removing wastes and extra water. Your urinary tract includes two kidneys, two ureters, a bladder, and a urethra. Your kidneys are a pair of bean-shaped organs. Each kidney is about the size of your fist. They are located below your ribs, one on each side of your spine. CAUSES Infections are caused by microbes, which are microscopic organisms, including fungi, viruses, and bacteria. These organisms are so small that they can only be seen through a microscope. Bacteria are the microbes that most commonly cause UTIs. SYMPTOMS  Symptoms of UTIs may vary by age and gender of the patient and by the location of the infection.  Symptoms in young women typically include a frequent  and intense urge to urinate and a painful, burning feeling in the bladder or urethra during urination. Older women and men are more likely to be tired, shaky, and weak and have muscle aches and abdominal pain. A fever may mean the infection is in your kidneys. Other symptoms of a kidney infection include pain in your back or sides below the ribs, nausea, and vomiting. DIAGNOSIS To diagnose a UTI, your caregiver will ask you about your symptoms. Your caregiver also will ask to provide a urine sample. The urine sample will be tested for bacteria and white blood cells. White blood cells are made by your body to help fight infection. TREATMENT  Typically, UTIs can be treated with medication. Because most UTIs are caused by a bacterial infection, they usually can be treated with the use of antibiotics. The choice of antibiotic and length of treatment depend on your symptoms and the type of bacteria causing your infection. HOME CARE INSTRUCTIONS  If you were prescribed antibiotics, take them exactly as your caregiver instructs you. Finish the medication even if you feel better after you have only taken some of the medication.  Drink enough water and fluids to keep your urine clear or pale yellow.  Avoid caffeine, tea, and carbonated beverages. They tend to irritate your bladder.  Empty your bladder often. Avoid holding urine for long periods of time.  Empty your bladder before and after sexual intercourse.  After a bowel movement, women should cleanse from front to back. Use each tissue only once. SEEK MEDICAL CARE IF:   You have back pain.  You develop a fever.  Your symptoms do not begin to resolve within 3 days. SEEK IMMEDIATE MEDICAL CARE IF:   You have severe back pain or lower abdominal pain.  You develop chills.  You have nausea or vomiting.  You have continued burning or discomfort with urination. MAKE SURE YOU:   Understand these instructions.  Will watch your  condition.  Will get help right away if you are not doing well or get worse. Document Released: 08/15/2005 Document Revised: 05/06/2012 Document Reviewed: 12/14/2011 [facility]ExitCare Patient Information 2015 WrightExitCare, MarylandLLC. This information is not intended to replace advice given to you by your health care provider. Make sure you discuss any questions you have with your health care provider.  Emergency Department Resource Guide 1) Find a Doctor and Pay Out of Pocket Although you won't have to find out who is covered by your insurance plan, it is a good idea to ask around and get recommendations. You will then need to call the office and see if the doctor you have chosen will accept you as a new patient and what types of options they offer for patients who are self-pay. Some doctors offer discounts or will set up payment plans for their patients who do not have insurance, but you will need to ask so you aren't surprised when you get to your appointment.  2) Contact Your Local Health Department Not all health departments have doctors that can see patients for sick visits, but many do, so it is worth a call to see if yours does. If you don't know where your local health department is, you can check in your phone book. The CDC also has a tool to help you locate your state's health department, and many state websites also have listings of all of their local health departments.  3) Find a Walk-in Clinic If your illness  is not likely to be very severe or complicated, you may want to try a walk in clinic. These are popping up all over the country in pharmacies, drugstores, and shopping centers. They're usually staffed by nurse practitioners or physician assistants that have been trained to treat common illnesses and complaints. They're usually fairly quick and inexpensive. However, if you have serious medical issues or chronic medical problems, these are probably not your best option.  No Primary Care  Doctor: - Call Health Connect at  [phone] - they can help you locate a primary care doctor that  accepts your insurance, provides certain services, etc. - Physician Referral Service- [phone]  Chronic Pain Problems: Organization         Address  Phone   Notes  [facility]  [phone] Patients need to be referred by their primary care doctor.   Medication Assistance: Organization         Address  Phone   Notes  [facility] Medication [facility] [address] [place], [place] [address] [phone] --Must be a resident of [facility] -- Must have NO insurance coverage whatsoever (no Medicaid/ Medicare, etc.) -- The pt. MUST have a primary care doctor that directs their care regularly and follows them in the community   Med[facility]ist  [phone]   [employer]  [phone]    Agencies that provide inexpensive medical care: Organization         Address  Phone   Notes  [place]  [phone]   [facility]    [phone]   [facility] [place], [place] [address] [phone]   [place] [address], [place] [phone]   Planned Parenthood    7168131328   [facility]    [phone]   [place] and [place] [address], [place] Phone:  [phone], Fax:  [phone] Hours of Operation:  9 am - 6 pm, M-F.  Also accepts Medicaid/Medicare and self-pay.  [facility] for [place], [address], [place] Phone: [phone], Fax: [phone]. Hours of Operation:  8:30 am - 5:30 pm, M-F.  Also accepts Medicaid and self-pay.  [facility] [place] [address] [place] Phone: [phone]   [place], [place], [place] [phone], Ext. [phone] Mondays & Thursdays: 7-9 AM.  First 15 patients are seen on a first  come, first serve basis.    [place] Providers:  Organization         Address  Phone   Notes  [facility] [address], [place] [phone] Also accepts self-pay patients.  [facility] [address], [place]  [phone]   [place], [address], [place] [phone]   [facility] [address], [place] [phone]   Lucianne Lei [address], [place]   [phone] Only accepts [place] Access [place] patients after they have their name applied to their card.   Self-Pay (no insurance) in [facility]:  Organization         Address  Phone   Notes  Sickle Cell Patients, [facility] Internal Medicine [place], [place] [phone]  [facility] [place] [phone]   [facility] [place]  [place], [address], [place] [phone]   [facility]/Dr. Osei-Bonsu  [address] [place] or [address], [place] [phone] Phone number for both [place] and [place] locations is the same.  Urgent Medical and [facility] [address], [place] [phone]   [facility] [address], [place] or [address] [phone]   [facility] [address] [place] [phone], phone; [phone], fax Sees patients 1st and 3rd Saturday of every month.  Must not qualify for public or private insurance (i.e. Medicaid, Medicare, [place] Health Choice, Veterans' Benefits)  Household income should be no more than 200% of the poverty level The clinic cannot treat you if you are pregnant or think you are pregnant  Sexually transmitted diseases are not treated at the clinic.    Dental Care: Organization          Address  Phone  Notes  [facility] Department of [facility] [place] [phone] Accepts children up to age 62 who are enrolled in [place] or [place]; pregnant women with a Medicaid card; and children who have applied for Medicaid or [place] Health Choice, but were declined, whose parents can pay a reduced fee at time of service.  [facility] Department of [facility]  [address], [place] [phone] Accepts children up to age 48 who are enrolled in [place] or [place]; pregnant women with a Medicaid card; and children who have applied for Medicaid or [place] Health Choice, but were declined, whose parents can pay a reduced fee at time of service.  [place] Adult Dental Access PROGRAM  [place] [phone] Patients are seen by appointment only. Walk-ins are not accepted. [place] will see patients 75 years of age and older. Monday - Tuesday (8am-5pm) Most Wednesdays (8:30-5pm) $30 per visit, cash only  [facility] Adult [facility] Access PRO[facility]  [address], [facility] [phone] Patients are seen by appointment only. Walk-ins are not accepted. [place] will see patients 80 years of age and older. One Wednesday Evening (Monthly: Volunteer Based).  $30 per visit, cash only  [place]  [phone] for adults; Children under age 62, call Graduate Pediatric Dentistry at [phone]. Children aged 58-14, please call [phone] to request a pediatric application.  Dental services are provided in all areas of dental care including fillings, crowns and bridges, complete and partial dentures, implants, gum treatment, root canals, and extractions. Preventive care is also provided. Treatment is provided to both adults and children. Patients are selected via a lottery and there is often a waiting list.   [facility] [address] [place]  [phone] [email or web address]   [facility] [address] [place], [place] [phone], Ext. [phone] Second and Fourth Thursday of each month, opens at 6:30 AM; Clinic ends at 9 AM.  Patients are seen on a first-come first-served basis, and a limited number are seen during each clinic.   [facility]  [address] Hillard Danker [place], [place] [phone]   Eligibility Requirements You must have lived in [place], [place], or [place] counties for at least the  last three months.   You cannot be eligible for state or federal sponsored [employer], including [employer], [place], or [employer].   You generally cannot be eligible for healthcare insurance through your employer.    How to apply: Eligibility screenings are held every Tuesday and Wednesday afternoon from 1:00 pm until 4:00 pm. You do not need an appointment for the interview!  [facility] [address] [place], [place]   [place]  [place] Department  [place]  [phone]    Behavioral Health Resources in the Community: Intensive Outpatient Programs Organization         Address  Phone  Notes  [place]. [address], [place], [place] [phone]   [facility] Outpatient [address] [place], [place]   ADS: Alcohol & Drug Svcs [address] [place], [place]   [place] [address],  [place], [place] or [phone]   Substance Abuse Resources Organization         Address  Phone  Notes  Alcohol and Drug Services  [phone]   [place]  [phone]   [facility] [place]   Chinita Pester  [phone]   Residential & Outpatient Substance Abuse Program  [phone]   Psychological  Services Organization         Address  Phone  Notes  [facility] [place]  [place]  [phone]   [place] [address], [place] or [phone]    Mobile Crisis Teams Organization         Address  Phone  Notes  [facility], Mobile Crisis Care Unit  [phone]   [facility]otherapeutic [facility]  [address] [place], [place]   [place] Levels [address], [place] [phone]    Self-Help/Support Groups Organization         Address  Phone             Notes  [place]. of [place] - variety of support groups  [place] Call for more information  Narcotics Anonymous (NA), [facility]aring [facility] [address], [employer] [place]  2 meetings at this location   Special educational needs teacher         Address  Phone  Notes  [facility]AP Residential Treatment [place],    [place]  [phone]   [facility]  [address] [place] [phone], [place], [place]   [facility] [place], [place] [phone] Admissions: 8am-3pm M-F  Incentives Substance [place] [address]    [place], [place] [phone]   [facility] [address] [place], [place], [place]   [facility] [address]  [place], [place]   [facility] Programs - Intensive Outpatient [address], [place], [place]   [facility] ([place].,  [place], [place] [phone] or [phone]   Residential Treatment Services (RTS) [address] [place], [place] Accepts Medicaid  [facility] [employer] [address],  [place] [phone] Substance Abuse/Addiction Treatment   [facility] Organization         Address  Phone  Notes  [facility]  [phone]  Domenic Schwab, PhD [address] Arlis Porta [place], [place]   [phone] or [phone]   [facility], [place] [phone]   [facility], [place], [place] [phone] Insurance/Medicaid/sponsorship through [facility] and Families [address], [address] [place]                                    [place], [place] [phone] [place] [address], [place] [phone]    Dr. Adele Schilder  [phone]   [facility] Dept. 1) [address] [place] 2) [place] 3)  [place] [address], [place] [phone]  [phone]   [place] [phone] or [phone] (After Hours)

## 2015-07-06 ENCOUNTER — Encounter (HOSPITAL_COMMUNITY): Payer: Self-pay | Admitting: Emergency Medicine

## 2015-07-06 ENCOUNTER — Emergency Department (HOSPITAL_COMMUNITY)
Admission: EM | Admit: 2015-07-06 | Discharge: 2015-07-06 | Disposition: A | Discharge status: Home / Self Care | Payer: No Typology Code available for payment source | Attending: Emergency Medicine | Admitting: Emergency Medicine

## 2015-07-06 DIAGNOSIS — Z8659 Personal history of other mental and behavioral disorders: Secondary | ICD-10-CM | POA: Insufficient documentation

## 2015-07-06 DIAGNOSIS — N898 Other specified noninflammatory disorders of vagina: Secondary | ICD-10-CM | POA: Insufficient documentation

## 2015-07-06 DIAGNOSIS — Z793 Long term (current) use of hormonal contraceptives: Secondary | ICD-10-CM | POA: Insufficient documentation

## 2015-07-06 LAB — WET PREP, GENITAL
Trich, Wet Prep: NONE SEEN
WBC WET PREP: NONE SEEN
Yeast Wet Prep HPF POC: NONE SEEN

## 2015-07-06 LAB — GC/CHLAMYDIA PROBE AMP (~~LOC~~) NOT AT ARMC
Chlamydia: NEGATIVE
Neisseria Gonorrhea: NEGATIVE

## 2015-07-06 MED ORDER — FLUCONAZOLE 150 MG PO TABS: 150.0000 mg | ORAL_TABLET | ORAL | Status: AC | PRN

## 2015-07-06 NOTE — ED Notes (Signed)
Pt d/c'd from continuous pulse oximetry and blood pressure cuff; pt already dressed and waiting for discharge paperwork

## 2015-07-06 NOTE — ED Provider Notes (Signed)
CSN: [id number]     Arrival date & time 07/06/15  0010 History  This chart was scribed for Michele Kaplan, MD by Michele Harrison, ED Scribe. This patient was seen in room A11C/A11C and the patient's care was started at 4:01 AM.     Chief Complaint  Patient presents with  . Vaginal Discharge  . Vaginal Itching   The history is provided by the patient. No language interpreter was used.    HPI Comments: Michele Harrison is a 34 y.o. female with Hx of anxiety who presents to the Emergency Department complaining of moderate, uncganged thick, yellow vaginal discharge with one episode of bleeding onset 3 weeks ago. Pt reports that she thought had a yeast infection due to a recent change in soap from mild Dove to cocoa butter scented. LNMP last month. She states she does not believe she is pregnant. She is currently sexually active and exclusive with her partner. No Hx of STD in the past year. Not on any medications. She has an IUD. She notes no relief with monistat. She denies abdominal pain.   Past Medical History  Diagnosis Date  . Anxiety    Past Surgical History  Procedure Laterality Date  . Cesarean section     No family history on file. Social History  Substance Use Topics  . Smoking status: Never Smoker   . Smokeless tobacco: None  . Alcohol Use: Yes   OB History    No data available     Review of Systems  Gastrointestinal: Negative for abdominal pain.  Genitourinary: Positive for vaginal bleeding and vaginal discharge.   A complete 10 system review of systems was obtained and all systems are negative except as noted in the HPI and PMH.     Allergies  Other  Home Medications   Prior to Admission medications   Medication Sig Start Date End Date Taking? Authorizing Provider  levonorgestrel (MIRENA) 20 MCG/24HR IUD 1 each by Intrauterine route continuous.   Yes Historical Provider, MD  acetaminophen-codeine 120-12 MG/5ML suspension Take 5 mLs by mouth every 6 (six) hours as  needed for pain. Patient not taking: Reported on 11/02/2014 02/07/13   [name], MD  cephALEXin (KEFLEX) 500 MG capsule Take 1 capsule (500 mg total) by mouth 4 (four) times daily. Patient not taking: Reported on 07/06/2015 11/02/14   [name], PA-C  fluconazole (DIFLUCAN) 150 MG tablet Take 1 tablet (150 mg total) by mouth every other day as needed. 07/06/15 07/13/15  Michele Kaplan, MD  ibuprofen (ADVIL,MOTRIN) 800 MG tablet Take 1 tablet (800 mg total) by mouth 3 (three) times daily. Patient not taking: Reported on 11/02/2014 02/07/13   [name], MD  promethazine (PHENERGAN) 25 MG tablet Take 1 tablet (25 mg total) by mouth every 6 (six) hours as needed for nausea. Patient not taking: Reported on 11/02/2014 02/07/13   [name], MD   BP 119/82 mmHg  Pulse 71  Temp(Src) 98.5 F (36.9 C) (Oral)  Resp 18  SpO2 100%  LMP 06/03/2015 Physical Exam  Constitutional: She is oriented to person, place, and time. She appears well-developed and well-nourished.  HENT:  Head: Normocephalic and atraumatic.  Eyes: Conjunctivae and EOM are normal. Pupils are equal, round, and reactive to light.  Neck: Normal range of motion. Neck supple.  Cardiovascular: Normal rate, regular rhythm, normal heart sounds and intact distal pulses.   No murmur heard. Pulmonary/Chest: Effort normal and breath sounds normal. No respiratory distress.  Lungs CTA.   Abdominal:  Soft. Bowel sounds are normal. She exhibits no distension. There is no tenderness. There is no rebound and no guarding.  Genitourinary: Vagina normal and uterus normal.  External exam - normal, no lesions Speculum exam: Pt has some white discharge, no blood Bimanual exam: Patient has no CMT, no adnexal tenderness or fullness and cervical os is closed  Musculoskeletal: Normal range of motion.  Neurological: She is alert and oriented to person, place, and time.  Skin: Skin is warm and dry.  Psychiatric: She has a normal mood and affect. Her  behavior is normal.  Nursing note and vitals reviewed.   ED Course  Procedures (including critical care time) DIAGNOSTIC STUDIES: Oxygen Saturation is 98% on RA, normal by my interpretation.    COORDINATION OF CARE: 4:04 AM Discussed treatment plan with pt at bedside and pt agreed to plan.   Labs Review Labs Reviewed  WET PREP, GENITAL - Abnormal; Notable for the following:    Clue Cells Wet Prep HPF POC FEW (*)    All other components within normal limits  GC/CHLAMYDIA PROBE AMP ([place]) NOT AT [facility]    Imaging Review No results found. I have personally reviewed and evaluated these images and lab results as part of my medical decision-making.   EKG Interpretation None      MDM   Final diagnoses:  Vaginal discharge    I personally performed the services described in this documentation, which was scribed in my presence. The recorded information has been reviewed and is accurate.  Pt comes in with cc of vaginal discharge. There is a white d/c. She has some itching. The wet prep is neg. GC and chlamydia also sent. We will still tx pt has difucan in case we have a false neg.  Michele Kaplan, MD 07/07/15 1045

## 2015-07-06 NOTE — Discharge Instructions (Signed)
Please take the meds as prescribed. The ER results show no yeast, but take the meds to see if the infection clears.

## 2015-07-06 NOTE — ED Notes (Signed)
Pt. reports vaginal discharge/itching for 3 weeks , denies dysuria , no fever or chills.

## 2019-06-02 ENCOUNTER — Ambulatory Visit: Payer: Self-pay

## 2019-06-02 ENCOUNTER — Telehealth: Payer: Self-pay

## 2019-06-02 DIAGNOSIS — Z20822 Contact with and (suspected) exposure to covid-19: Secondary | ICD-10-CM

## 2019-06-02 NOTE — Telephone Encounter (Signed)
Incoming call from [employer] [facility] [employer]  Requesting that Patient be tested for  Covid-19.  Call to Patient.  Left message for return call.

## 2019-06-02 NOTE — Telephone Encounter (Signed)
Incoming call from [place] [facility] [place] requesting that Patient be tested for Covid-19.  Call to Patient.  Patient schedule for Wed.  06/03/19 @ 08:45am .  Patient voices understanding.

## 2019-06-02 NOTE — Telephone Encounter (Signed)
Incoming call from [facility] requesting that Fraser Din be tested for Coid-19.

## 2019-06-03 ENCOUNTER — Other Ambulatory Visit: Payer: Self-pay

## 2019-06-03 DIAGNOSIS — Z20822 Contact with and (suspected) exposure to covid-19: Secondary | ICD-10-CM

## 2019-06-07 LAB — NOVEL CORONAVIRUS, NAA: SARS-CoV-2, NAA: NOT DETECTED

## 2019-09-04 ENCOUNTER — Other Ambulatory Visit: Payer: Self-pay | Admitting: Obstetrics and Gynecology

## 2019-09-04 DIAGNOSIS — N6320 Unspecified lump in the left breast, unspecified quadrant: Secondary | ICD-10-CM

## 2019-09-10 ENCOUNTER — Ambulatory Visit
Admission: RE | Admit: 2019-09-10 | Discharge: 2019-09-10 | Disposition: A | Discharge status: Home / Self Care | Payer: BC Managed Care – PPO | Source: Ambulatory Visit | Attending: Obstetrics and Gynecology | Admitting: Obstetrics and Gynecology

## 2019-09-10 ENCOUNTER — Other Ambulatory Visit: Payer: Self-pay

## 2019-09-10 DIAGNOSIS — N6320 Unspecified lump in the left breast, unspecified quadrant: Secondary | ICD-10-CM

## 2020-03-03 ENCOUNTER — Other Ambulatory Visit: Payer: Self-pay | Admitting: Obstetrics and Gynecology

## 2020-03-03 DIAGNOSIS — N63 Unspecified lump in unspecified breast: Secondary | ICD-10-CM

## 2020-03-04 ENCOUNTER — Other Ambulatory Visit: Payer: Self-pay | Admitting: Obstetrics and Gynecology

## 2020-03-04 ENCOUNTER — Ambulatory Visit
Admission: RE | Admit: 2020-03-04 | Discharge: 2020-03-04 | Disposition: A | Discharge status: Home / Self Care | Payer: BC Managed Care – PPO | Source: Ambulatory Visit | Attending: Obstetrics and Gynecology | Admitting: Obstetrics and Gynecology

## 2020-03-04 ENCOUNTER — Other Ambulatory Visit: Payer: Self-pay

## 2020-03-04 DIAGNOSIS — N644 Mastodynia: Secondary | ICD-10-CM

## 2020-03-04 DIAGNOSIS — N6320 Unspecified lump in the left breast, unspecified quadrant: Secondary | ICD-10-CM

## 2020-03-04 DIAGNOSIS — N63 Unspecified lump in unspecified breast: Secondary | ICD-10-CM

## 2020-06-23 IMAGING — MG DIGITAL DIAGNOSTIC BILAT W/ TOMO W/ CAD
6 of 12 series · 6 of 36 positions shown · non-contrast
Comparison: Previous exam(s).

CLINICAL DATA: Palpable lump in the left breast

EXAM:
DIGITAL DIAGNOSTIC BILATERAL MAMMOGRAM WITH CAD AND TOMO
ULTRASOUND LEFT BREAST

[L CC synth-2D]
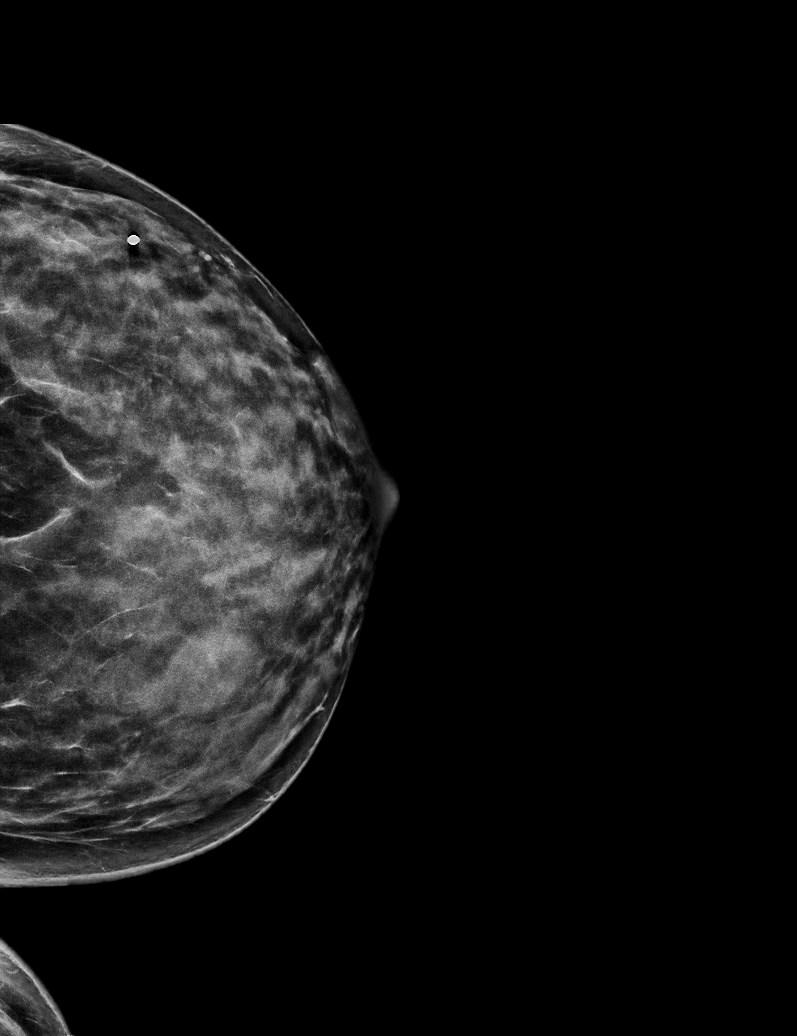

[R CC synth-2D]
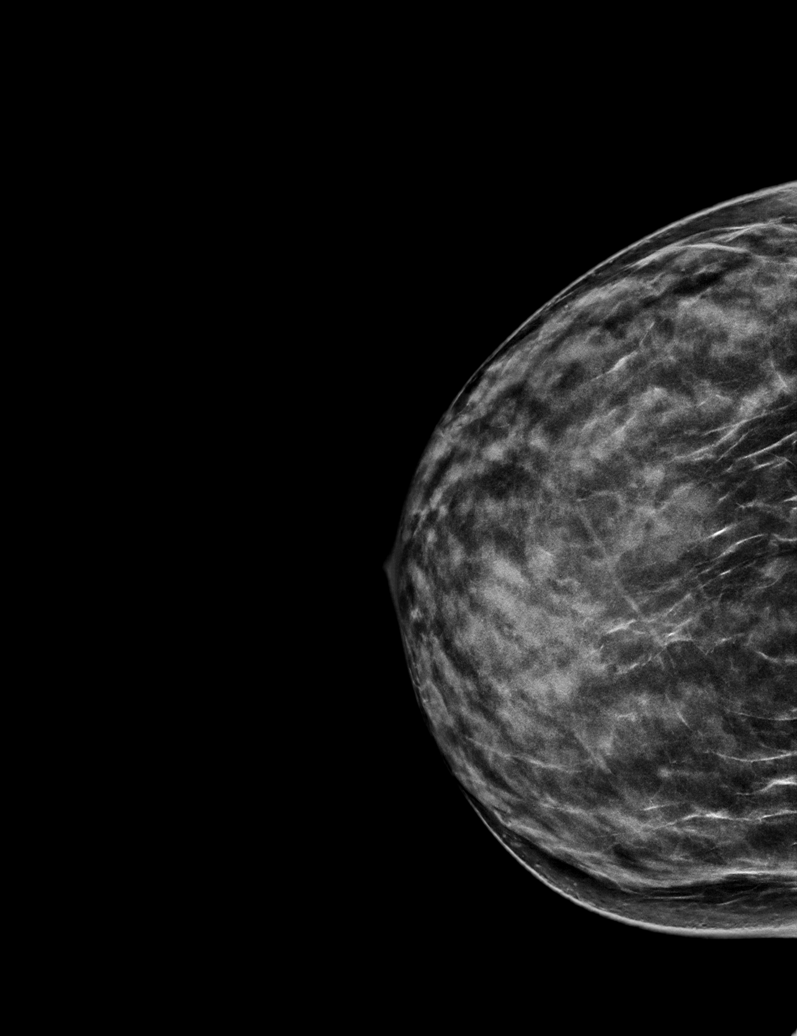

[R MLO synth-2D]
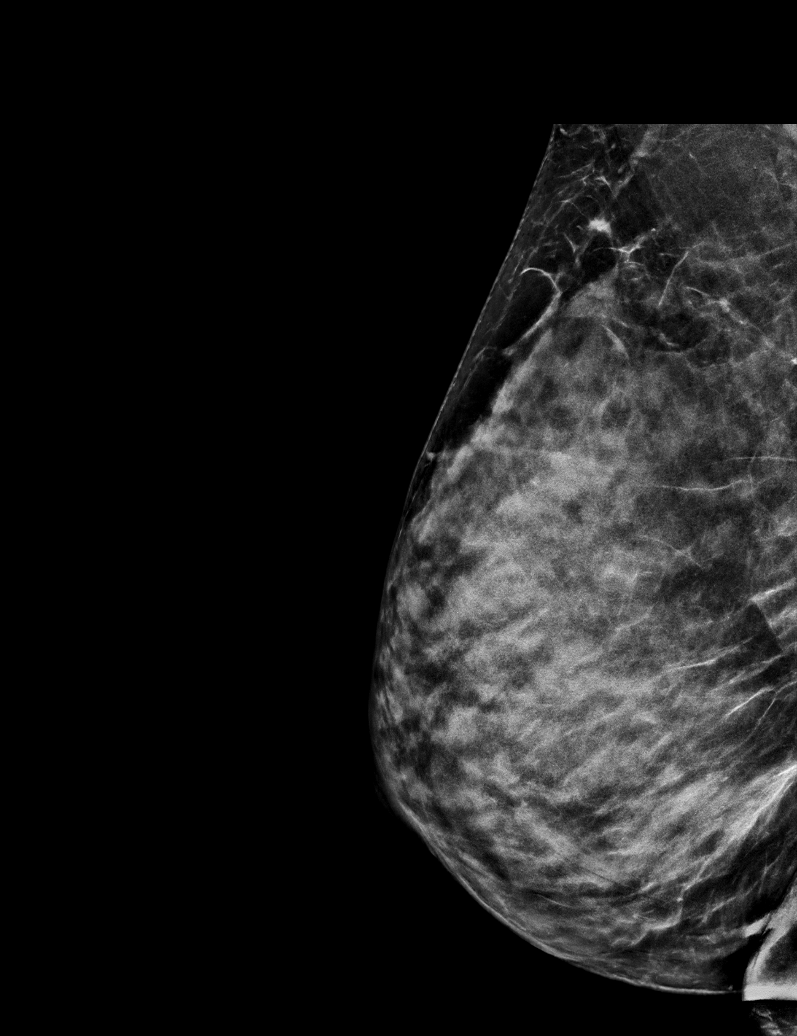

[L MLO synth-2D]
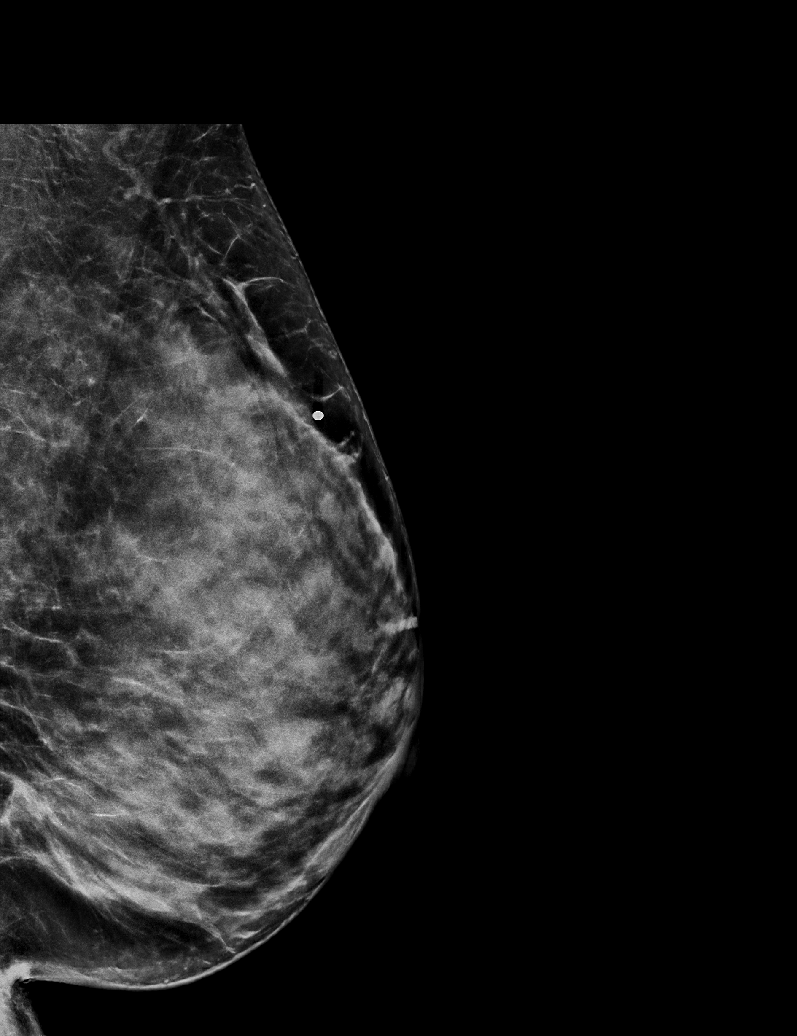

[L XCCL synth-2D]
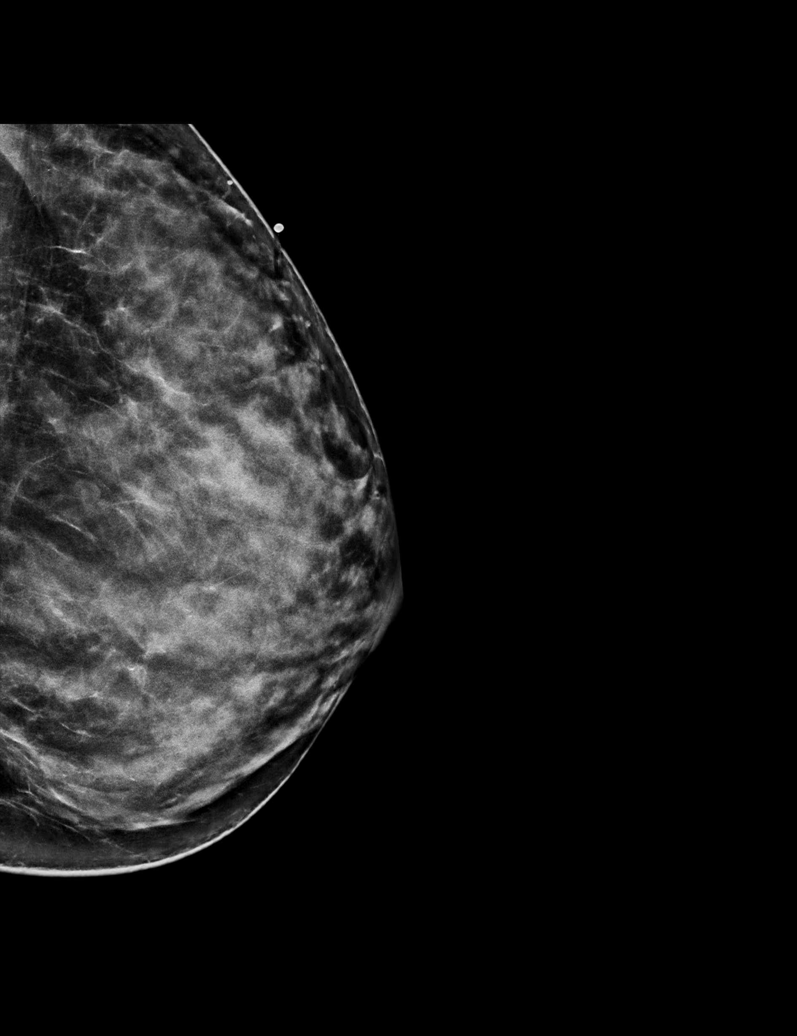

[L TAN synth-2D]
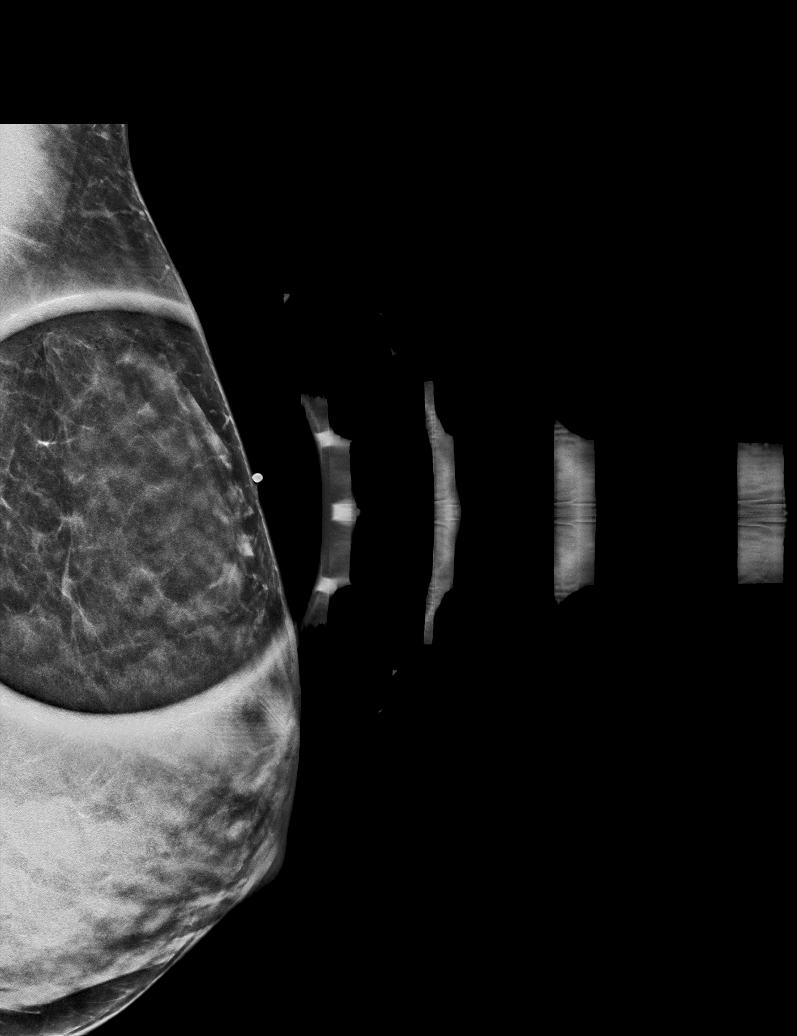

[6 of 36 positions shown; findings below may reference images not displayed]

ACR Breast Density Category d: The breast tissue is extremely dense,
which lowers the sensitivity of mammography.
FINDINGS: There is a mass in the medial superior left breast. No other
suspicious mammographic findings are identified.

Mammographic images were processed with CAD.

On physical exam, no suspicious lumps are identified.

Targeted ultrasound is performed, showing an island of dense
glandular tissue in the region of the patient's lump. Multiple cysts
are seen throughout the left breast, correlating with the
mammographically identified mass.
IMPRESSION: Fibrocystic changes.  No evidence of malignancy.

RECOMMENDATION:
Treatment of the patient's symptoms should be based on clinical and
physical exam given lack of imaging findings. Recommend annual
screening mammography beginning at the age of 40.

I have discussed the findings and recommendations with the patient.
If applicable, a reminder letter will be sent to the patient
regarding the next appointment.

BI-RADS CATEGORY  2: Benign.

## 2022-10-15 ENCOUNTER — Other Ambulatory Visit: Payer: Self-pay | Admitting: Obstetrics and Gynecology

## 2022-10-15 DIAGNOSIS — R928 Other abnormal and inconclusive findings on diagnostic imaging of breast: Secondary | ICD-10-CM

## 2023-06-20 ENCOUNTER — Ambulatory Visit
Admission: RE | Admit: 2023-06-20 | Discharge: 2023-06-20 | Disposition: A | Discharge status: Home / Self Care | Payer: BC Managed Care – PPO | Source: Ambulatory Visit | Attending: Obstetrics and Gynecology | Admitting: Obstetrics and Gynecology

## 2023-06-20 DIAGNOSIS — R928 Other abnormal and inconclusive findings on diagnostic imaging of breast: Secondary | ICD-10-CM

## 2024-12-24 ENCOUNTER — Encounter (HOSPITAL_COMMUNITY): Payer: Self-pay | Admitting: Emergency Medicine

## 2024-12-24 ENCOUNTER — Emergency Department (HOSPITAL_COMMUNITY)
Admission: EM | Admit: 2024-12-24 | Discharge: 2024-12-24 | Disposition: A | Discharge status: Home / Self Care | Source: Ambulatory Visit | Attending: Emergency Medicine | Admitting: Emergency Medicine

## 2024-12-24 ENCOUNTER — Emergency Department (HOSPITAL_COMMUNITY)

## 2024-12-24 ENCOUNTER — Other Ambulatory Visit: Payer: Self-pay

## 2024-12-24 ENCOUNTER — Ambulatory Visit (HOSPITAL_COMMUNITY)
Admission: EM | Admit: 2024-12-24 | Discharge: 2024-12-24 | Disposition: A | Discharge status: Home / Self Care | Source: Ambulatory Visit | Attending: Family Medicine | Admitting: Family Medicine

## 2024-12-24 DIAGNOSIS — G43001 Migraine without aura, not intractable, with status migrainosus: Secondary | ICD-10-CM | POA: Insufficient documentation

## 2024-12-24 DIAGNOSIS — R519 Headache, unspecified: Secondary | ICD-10-CM | POA: Diagnosis not present

## 2024-12-24 HISTORY — DX: Migraine, unspecified, not intractable, without status migrainosus: G43.909

## 2024-12-24 LAB — CBC WITH DIFFERENTIAL/PLATELET
Abs Immature Granulocytes: 0.03 10*3/uL (ref 0.00–0.07)
Basophils Absolute: 0 10*3/uL (ref 0.0–0.1)
Basophils Relative: 0 %
Eosinophils Absolute: 0.1 10*3/uL (ref 0.0–0.5)
Eosinophils Relative: 1 %
HCT: 44.7 % (ref 36.0–46.0)
Hemoglobin: 14 g/dL (ref 12.0–15.0)
Immature Granulocytes: 0 %
Lymphocytes Relative: 26 %
Lymphs Abs: 2.8 10*3/uL (ref 0.7–4.0)
MCH: 28.1 pg (ref 26.0–34.0)
MCHC: 31.3 g/dL (ref 30.0–36.0)
MCV: 89.8 fL (ref 80.0–100.0)
Monocytes Absolute: 0.8 10*3/uL (ref 0.1–1.0)
Monocytes Relative: 7 %
Neutro Abs: 6.8 10*3/uL (ref 1.7–7.7)
Neutrophils Relative %: 66 %
Platelets: 261 10*3/uL (ref 150–400)
RBC: 4.98 MIL/uL (ref 3.87–5.11)
RDW: 12.9 % (ref 11.5–15.5)
WBC: 10.4 10*3/uL (ref 4.0–10.5)
nRBC: 0 % (ref 0.0–0.2)

## 2024-12-24 LAB — COMPREHENSIVE METABOLIC PANEL WITH GFR
ALT: 10 U/L (ref 0–44)
AST: 19 U/L (ref 15–41)
Albumin: 4 g/dL (ref 3.5–5.0)
Alkaline Phosphatase: 85 U/L (ref 38–126)
Anion gap: 9 (ref 5–15)
BUN: 8 mg/dL (ref 6–20)
CO2: 24 mmol/L (ref 22–32)
Calcium: 8.9 mg/dL (ref 8.9–10.3)
Chloride: 104 mmol/L (ref 98–111)
Creatinine, Ser: 0.72 mg/dL (ref 0.44–1.00)
GFR, Estimated: >60 mL/min
Glucose, Bld: 92 mg/dL (ref 70–99)
Potassium: 4 mmol/L (ref 3.5–5.1)
Sodium: 138 mmol/L (ref 135–145)
Total Bilirubin: 0.2 mg/dL (ref 0.0–1.2)
Total Protein: 7.5 g/dL (ref 6.5–8.1)

## 2024-12-24 LAB — MAGNESIUM: Magnesium: 2.2 mg/dL (ref 1.7–2.4)

## 2024-12-24 LAB — HCG, SERUM, QUALITATIVE: Preg, Serum: NEGATIVE

## 2024-12-24 MED ORDER — ACETAMINOPHEN 500 MG PO TABS
1000.0000 mg | ORAL_TABLET | Freq: Once | ORAL | Status: AC
  Administered 2024-12-24: 1000 mg via ORAL
  Filled 2024-12-24: qty 2

## 2024-12-24 MED ORDER — PROCHLORPERAZINE EDISYLATE 10 MG/2ML IJ SOLN
10.0000 mg | Freq: Once | INTRAMUSCULAR | Status: AC
  Administered 2024-12-24: 10 mg via INTRAVENOUS
  Filled 2024-12-24: qty 2

## 2024-12-24 MED ORDER — LACTATED RINGERS IV BOLUS
1000.0000 mL | Freq: Once | INTRAVENOUS | Status: AC
  Administered 2024-12-24: 1000 mL via INTRAVENOUS

## 2024-12-24 MED ORDER — KETOROLAC TROMETHAMINE 15 MG/ML IJ SOLN
15.0000 mg | Freq: Once | INTRAMUSCULAR | Status: AC
  Administered 2024-12-24: 15 mg via INTRAVENOUS
  Filled 2024-12-24: qty 1

## 2024-12-24 MED ORDER — DIPHENHYDRAMINE HCL 50 MG/ML IJ SOLN
12.5000 mg | Freq: Once | INTRAMUSCULAR | Status: AC
  Administered 2024-12-24: 12.5 mg via INTRAVENOUS
  Filled 2024-12-24: qty 1

## 2024-12-24 NOTE — ED Triage Notes (Signed)
 Patient presents due to intermittent migraines for 2 weeks. She has been taking BC powder to help with pain, but last night headache became worse than before. She describes it as a pressure/ sharp pain that radiates into her neck. She also complains of dizziness and blurred vision. Urgent care sent her here for a CT scan.

## 2024-12-24 NOTE — Discharge Instructions (Addendum)
 You were seen today for your headache No specific cause was found today for your headache. It may have been a migraine or other cause of headache. Stress, anxiety, fatigue, weather changes are common triggers for headaches.   You may take up to 1000mg  of tylenol  every 6 hours as needed for headache. Do not take more then 4g per day.   You may use up to 600mg  ibuprofen  every 6 hours as needed for headache.  Do not exceed 2.4g of ibuprofen  per day. If you are taking ibuprofen , do not take other NSAID medications such as BC powders, Aleve, naproxen.   Tests performed today include: CT of your head which was normal and did not show any serious cause of your headache  Your blood counts and electrolytes were normal today.   Follow-up instructions: Please follow-up with your primary care provider if your headaches are not being controlled with over-the-counter medications like Tylenol  and ibuprofen  or becoming more frequent.  Your blood pressure was elevated here today at 162/130. Aim to get 30 minutes of exercise daily and limit intake of processed/fast foods. Please take and record your blood pressures at home. Take them at the same time every day. Follow up with your PCP within the next month to discuss if you may need treatment for your blood pressure.   Return instructions:  Please return to the Emergency Department if you experience worsening symptoms. Return if the medications do not resolve your headache, if it recurs, or if you have multiple episodes of vomiting or cannot keep down fluids. Return if you have a change from your usual headache. RETURN IMMEDIATELY IF you: Develop a sudden, severe headache Develop confusion or become poorly responsive or faint Develop a fever above 100.26F or problem breathing Have a change in speech, vision, swallowing, or understanding Develop new weakness, numbness, tingling, incoordination in your arms or legs Have a seizure Have lots of pain in your  neck Please return if you have any other emergent concerns.

## 2024-12-24 NOTE — ED Triage Notes (Signed)
 Pt reports for past 2 weeks having migraines. Taking BC powder.people told her that her neck was sore and now top of head is really sore. Feels like a lot of pressure on her brain. Reports when moves feels pulsating.

## 2024-12-24 NOTE — ED Notes (Signed)
 Patient is being discharged from the Urgent Care and sent to the Emergency Department via private vehicle . Per Dr Vonna, patient is in need of higher level of care due to headache/HTN. Patient is aware and verbalizes understanding of plan of care.  Vitals:   12/24/24 1257  BP: (!) 158/108  Pulse: 74  Resp: 18  Temp: 98.2 F (36.8 C)  SpO2: 97%

## 2024-12-24 NOTE — ED Provider Notes (Signed)
 " [facility]    CSN: [id number] Arrival date & time: 12/24/24  1239      History   Chief Complaint Chief Complaint  Patient presents with   Headache    HPI Michele Harrison is a 44 y.o. female.    Headache  Here for headache that has been bothering her for 2 weeks.  At first it would get better when she lay down.  She has noticed some neck pain and now her head is bothering her over the entire head with lancinating and throbbing and pounding and lots of pressure.  No nausea vomiting or photophobia.  She has occasionally noted some blurry vision.  She occasionally has had some numbness in her hands.  She states a long time ago she might have been diagnosed with migraines.  Last menstrual cycle was January 7 and she has an IUD  NKDA  Past Medical History:  Diagnosis Date   Anxiety     There are no active problems to display for this patient.   Past Surgical History:  Procedure Laterality Date   CESAREAN SECTION      OB History   No obstetric history on file.      Home Medications    Prior to Admission medications  Medication Sig Start Date End Date Taking? Authorizing Provider  levonorgestrel (MIRENA) 20 MCG/24HR IUD 1 each by Intrauterine route continuous.    [provider]    Family History Family History  Problem Relation Age of Onset   Brain cancer Father    Ovarian cancer Maternal Aunt    Lung cancer Paternal Grandmother    BRCA 1/2 Neg Hx    Breast cancer Neg Hx     Social History Social History[1]   Allergies   Other   Review of Systems Review of Systems  Neurological:  Positive for headaches.     Physical Exam Triage Vital Signs ED Triage Vitals  Encounter Vitals Group     BP 12/24/24 1257 (!) 158/108     Girls Systolic BP Percentile --      Girls Diastolic BP Percentile --      Boys Systolic BP Percentile --      Boys Diastolic BP Percentile --      Pulse Rate 12/24/24 1257 74     Resp 12/24/24 1257  18     Temp 12/24/24 1257 98.2 F (36.8 C)     Temp Source 12/24/24 1257 Oral     SpO2 12/24/24 1257 97 %     Weight --      Height --      Head Circumference --      Peak Flow --      Pain Score 12/24/24 1255 5     Pain Loc --      Pain Education --      Exclude from Growth Chart --    No data found.  Updated Vital Signs BP (!) 158/108 (BP Location: Left Arm)   Pulse 74   Temp 98.2 F (36.8 C) (Oral)   Resp 18   LMP 11/25/2024 (Approximate)   SpO2 97%   Visual Acuity Right Eye Distance:   Left Eye Distance:   Bilateral Distance:    Right Eye Near:   Left Eye Near:    Bilateral Near:     Physical Exam Vitals reviewed.  Constitutional:      General: She is not in acute distress.    Appearance: She is not  ill-appearing, toxic-appearing or diaphoretic.  HENT:     Mouth/Throat:     Mouth: Mucous membranes are moist.  Eyes:     Extraocular Movements: Extraocular movements intact.     Pupils: Pupils are equal, round, and reactive to light.  Cardiovascular:     Rate and Rhythm: Normal rate and regular rhythm.     Heart sounds: No murmur heard. Pulmonary:     Effort: Pulmonary effort is normal.     Breath sounds: Normal breath sounds.  Musculoskeletal:     Cervical back: Tenderness present.  Lymphadenopathy:     Cervical: No cervical adenopathy.  Skin:    Coloration: Skin is not pale.  Neurological:     General: No focal deficit present.     Mental Status: She is alert and oriented to person, place, and time.  Psychiatric:        Behavior: Behavior normal.      UC Treatments / Results  Labs (all labs ordered are listed, but only abnormal results are displayed) Labs Reviewed - No data to display  EKG   Radiology No results found.  Procedures Procedures (including critical care time)  Medications Ordered in UC Medications - No data to display  Initial Impression / Assessment and Plan / UC Course  I have reviewed the triage vital signs and the  nursing notes.  Pertinent labs & imaging results that were available during my care of the patient were reviewed by me and considered in my medical decision making (see chart for details).    While this may be an atypical migraine, there are many features of her history that do not indicate that this is migrainous in nature.  I have asked her to proceed to the emergency room for further evaluation and treatment.  Also she has had lots of BC powders in the last 24 hours and I do not think it is the best idea for us  to give her a Toradol  injection on top of that here.  She will proceed to the emergency room by private vehicle with her friend driving Final Clinical Impressions(s) / UC Diagnoses   Final diagnoses:  None   Discharge Instructions   None    ED Prescriptions   None    PDMP not reviewed this encounter.    [1]  Social History Tobacco Use   Smoking status: Never  Substance Use Topics   Alcohol use: Yes   Drug use: No     Vonna Sharlet POUR, MD 12/24/24 1314  "

## 2024-12-24 NOTE — Discharge Instructions (Signed)
 She went to the emergency room for further evaluation and treatment

## 2024-12-24 NOTE — ED Provider Notes (Signed)
 " [place] EMERGENCY DEPARTMENT AT [facility] Provider Note   CSN: [id number] Arrival date & time: 12/24/24  1351     Patient presents with: Migraine   Michele Harrison is a 44 y.o. female with no significant past medical history presents with concern for a headache that has been ongoing for the past 2 weeks.  Reports that she has a pulsing sensation throughout her head which moves to different spots.  She reports some sensitivity to sounds but not to light.  Reports her  vision sometimes seems blurrier, but no double vision.  Reports  pain sometimes goes into the muscles of her neck.  Denies any new rashes, fevers.  Reports she has been taking BC powders at home without improvement in symptoms.  Reports a remote history of migraines when she was a teenager, but states she has not gotten any recently.  Denies any head trauma.    Migraine Associated symptoms include headaches.       Prior to Admission medications  Medication Sig Start Date End Date Taking? Authorizing Provider  levonorgestrel (MIRENA) 20 MCG/24HR IUD 1 each by Intrauterine route continuous.    [provider]    Allergies: Other    Review of Systems  Neurological:  Positive for headaches.    Updated Vital Signs BP (!) 162/130 (BP Location: Left Arm)   Pulse 67   Temp 97.9 F (36.6 C) (Oral)   Resp 16   LMP 11/25/2024 (Approximate)   SpO2 98%   Physical Exam Vitals and nursing note reviewed.  Constitutional:      General: She is not in acute distress.    Appearance: She is well-developed.  HENT:     Head: Normocephalic and atraumatic.  Eyes:     Conjunctiva/sclera: Conjunctivae normal.  Neck:     Comments: Moves neck through full range of motion without difficulty Nontender over the cervical, thoracic, lumbar spine  Cardiovascular:     Rate and Rhythm: Normal rate and regular rhythm.     Heart sounds: No murmur heard. Pulmonary:     Effort: Pulmonary effort is normal. No  respiratory distress.     Breath sounds: Normal breath sounds.  Abdominal:     Palpations: Abdomen is soft.     Tenderness: There is no abdominal tenderness.  Musculoskeletal:        General: No swelling.     Cervical back: Normal range of motion and neck supple. No rigidity.  Skin:    General: Skin is warm and dry.     Capillary Refill: Capillary refill takes less than 2 seconds.     Findings: No rash.  Neurological:     General: No focal deficit present.     Mental Status: She is alert and oriented to person, place, and time.  Psychiatric:        Mood and Affect: Mood normal.     (all labs ordered are listed, but only abnormal results are displayed) Labs Reviewed  CBC WITH DIFFERENTIAL/PLATELET  COMPREHENSIVE METABOLIC PANEL WITH GFR  MAGNESIUM  HCG, SERUM, QUALITATIVE    EKG: None  Radiology: CT Head Wo Contrast Result Date: 12/24/2024 EXAM: CT HEAD WITHOUT CONTRAST 12/24/2024 02:52:25 PM TECHNIQUE: CT of the head was performed without the administration of intravenous contrast. Automated exposure control, iterative reconstruction, and/or weight based adjustment of the mA/kV was utilized to reduce the radiation dose to as low as reasonably achievable. COMPARISON: None available. CLINICAL HISTORY: Headache, increasing frequency or severity Headache, increasing  frequency or severity. FINDINGS: BRAIN AND VENTRICLES: No acute hemorrhage. No evidence of acute infarct. No hydrocephalus. No extra-axial collection. No mass effect or midline shift. ORBITS: No acute abnormality. SINUSES: No acute abnormality. SOFT TISSUES AND SKULL: No acute soft tissue abnormality. No skull fracture. IMPRESSION: 1. No acute intracranial abnormality. Electronically signed by: Prentice Spade MD 12/24/2024 03:02 PM EST RP Workstation: [id number]     Procedures   Medications Ordered in the ED  ketorolac  (TORADOL ) 15 MG/ML injection 15 mg (15 mg Intravenous Given 12/24/24 1552)  prochlorperazine   (COMPAZINE ) injection 10 mg (10 mg Intravenous Given 12/24/24 1554)  diphenhydrAMINE  (BENADRYL ) injection 12.5 mg (12.5 mg Intravenous Given 12/24/24 1553)  acetaminophen  (TYLENOL ) tablet 1,000 mg (1,000 mg Oral Given 12/24/24 1552)  lactated ringers  bolus 1,000 mL (1,000 mLs Intravenous New Bag/Given 12/24/24 1559)                                    Medical Decision Making Risk OTC drugs. Prescription drug management.     Differential diagnosis includes but is not limited to Tension headache, migraine, temporal arteritis, trigeminal neuralgia, cluster headache, meningitis, concussion, intracranial mass, intracranial hemorrhage, carbon monoxide poisoning, sinus venous thrombosis   ED Course:  Upon initial evaluation, patient is well-appearing, no acute distress.  Normal vitals aside from elevated blood pressure upon arrival 162/130.  Reporting diffuse headache ongoing for the past 2 weeks.  No neurologic deficits on exam.  No nuchal rigidity.  No rashes.No fever. No concern for meningitis.  Will obtain labs.  Will obtain CT head given this is a new headache for the patient not controlled with normal home medications.  Labs Ordered: I Ordered, and personally interpreted labs.  The pertinent results include:   CBC and CMP within normal limits Magnesium normal Pregnancy negative  Imaging Studies ordered: I ordered imaging studies including CT head I independently visualized the imaging with scope of interpretation limited to determining acute life threatening conditions related to emergency care. Imaging showed no acute abnormality I agree with the radiologist interpretation   Medications Given: Compazine  Benadryl  Toradol  Tylenol  LR bolus  Upon re-evaluation, patient reports headache is completely resolved. She reports her blurred vision has resolved with resolution of headache. She is feeling much better.  She would like to go home at this time.  Given her labs and head CT are  unremarkable, and headache improved, vitals stable, feel she is stable and appropriate for discharge home at this time. This is likely ongoing tension vs migraine headache.    Impression: Migraine, resolved  Disposition:  The patient was discharged home with instructions to take Tylenol  and ibuprofen  as needed for headache at home.  Follow-up with PCP for further management of migraines and blood pressure control within the next month. Return precautions given and patient verbalized understanding.    This chart was dictated using voice recognition software, Dragon. Despite the best efforts of this provider to proofread and correct errors, errors may still occur which can change documentation meaning.       Final diagnoses:  Migraine without aura and with status migrainosus, not intractable    ED Discharge Orders     None          Veta Palma, PA-C 12/24/24 1734    Ellouise Richerd POUR, DO 12/24/24 2254  "

## 2024-12-24 NOTE — ED Provider Triage Note (Cosign Needed)
 Emergency Medicine Provider Triage Evaluation Note  Michele Harrison , a 44 y.o. female  was evaluated in triage.  Pt complains of migraines intermittently over the past 2 weeks. Does have hx of migraines but they are stronger recently. Also with HTN. BC goody powder no longer providing relief.  Review of Systems  Positive:  Negative:   Physical Exam  BP (!) 162/130 (BP Location: Left Arm)   Pulse 67   Temp 97.9 F (36.6 C) (Oral)   Resp 16   LMP 11/25/2024 (Approximate)   SpO2 98%  Gen:   Awake, no distress   Resp:  Normal effort  MSK:   Moves extremities without difficulty  Other:    Medical Decision Making  Medically screening exam initiated at 2:37 PM.  Appropriate orders placed.  Michele Harrison was informed that the remainder of the evaluation will be completed by another provider, this initial triage assessment does not replace that evaluation, and the importance of remaining in the ED until their evaluation is complete.     Michele Harrison, [place] 12/24/24 1438
